# Patient Record
Sex: Female | Born: 1985 | Race: Black or African American | Hispanic: No | Marital: Married | Smoking: Never smoker
Health system: Southern US, Community
[De-identification: ages and names within clinical notes are randomized; demographics above are authoritative.]

## PROBLEM LIST (undated history)

## (undated) HISTORY — PX: ASD REPAIR: SHX258

---

## 2004-05-09 ENCOUNTER — Emergency Department (HOSPITAL_COMMUNITY): Admission: EM | Admit: 2004-05-09 | Discharge: 2004-05-10 | Payer: Self-pay | Admitting: Emergency Medicine

## 2005-12-07 ENCOUNTER — Emergency Department (HOSPITAL_COMMUNITY): Admission: EM | Admit: 2005-12-07 | Discharge: 2005-12-07 | Payer: Self-pay | Admitting: Emergency Medicine

## 2007-02-19 ENCOUNTER — Emergency Department (HOSPITAL_COMMUNITY): Admission: EM | Admit: 2007-02-19 | Discharge: 2007-02-19 | Payer: Self-pay | Admitting: Emergency Medicine

## 2007-08-31 ENCOUNTER — Emergency Department (HOSPITAL_COMMUNITY): Admission: EM | Admit: 2007-08-31 | Discharge: 2007-08-31 | Payer: Self-pay | Admitting: Family Medicine

## 2007-09-07 ENCOUNTER — Emergency Department (HOSPITAL_COMMUNITY): Admission: EM | Admit: 2007-09-07 | Discharge: 2007-09-07 | Payer: Self-pay | Admitting: Emergency Medicine

## 2011-06-09 LAB — GC/CHLAMYDIA PROBE AMP, GENITAL
Chlamydia, DNA Probe: NEGATIVE
GC Probe Amp, Genital: NEGATIVE

## 2011-06-09 LAB — WET PREP, GENITAL
Trich, Wet Prep: NONE SEEN
Yeast Wet Prep HPF POC: NONE SEEN

## 2011-06-09 LAB — POCT PREGNANCY, URINE
Operator id: 235561
Preg Test, Ur: NEGATIVE

## 2011-06-09 LAB — POCT URINALYSIS DIP (DEVICE)
Glucose, UA: NEGATIVE
Hgb urine dipstick: NEGATIVE
Ketones, ur: NEGATIVE
Nitrite: NEGATIVE
Operator id: 235561
Specific Gravity, Urine: 1.025
Urobilinogen, UA: 1
pH: 6

## 2014-02-14 ENCOUNTER — Emergency Department (HOSPITAL_BASED_OUTPATIENT_CLINIC_OR_DEPARTMENT_OTHER): Payer: Managed Care, Other (non HMO)

## 2014-02-14 ENCOUNTER — Encounter (HOSPITAL_BASED_OUTPATIENT_CLINIC_OR_DEPARTMENT_OTHER): Payer: Self-pay | Admitting: Emergency Medicine

## 2014-02-14 ENCOUNTER — Emergency Department (HOSPITAL_BASED_OUTPATIENT_CLINIC_OR_DEPARTMENT_OTHER)
Admission: EM | Admit: 2014-02-14 | Discharge: 2014-02-15 | Disposition: A | Discharge status: Home / Self Care | Payer: Managed Care, Other (non HMO) | Attending: Emergency Medicine | Admitting: Emergency Medicine

## 2014-02-14 DIAGNOSIS — M79604 Pain in right leg: Secondary | ICD-10-CM

## 2014-02-14 DIAGNOSIS — Z880 Allergy status to penicillin: Secondary | ICD-10-CM | POA: Insufficient documentation

## 2014-02-14 DIAGNOSIS — M79605 Pain in left leg: Secondary | ICD-10-CM

## 2014-02-14 DIAGNOSIS — M79609 Pain in unspecified limb: Secondary | ICD-10-CM | POA: Insufficient documentation

## 2014-02-14 DIAGNOSIS — O9989 Other specified diseases and conditions complicating pregnancy, childbirth and the puerperium: Secondary | ICD-10-CM | POA: Insufficient documentation

## 2014-02-14 DIAGNOSIS — Z3490 Encounter for supervision of normal pregnancy, unspecified, unspecified trimester: Secondary | ICD-10-CM

## 2014-02-14 LAB — BASIC METABOLIC PANEL
BUN: 6 mg/dL (ref 6–23)
CO2: 21 mEq/L (ref 19–32)
Calcium: 9.4 mg/dL (ref 8.4–10.5)
Chloride: 102 mEq/L (ref 96–112)
Creatinine, Ser: 0.6 mg/dL (ref 0.50–1.10)
GFR calc Af Amer: >90 mL/min (ref >90)
GFR calc non Af Amer: >90 mL/min (ref >90)
Glucose, Bld: 112 mg/dL — ABNORMAL HIGH (ref 70–99)
Potassium: 3.8 mEq/L (ref 3.7–5.3)
Sodium: 136 mEq/L — ABNORMAL LOW (ref 137–147)

## 2014-02-14 LAB — CBC WITH DIFFERENTIAL/PLATELET
Basophils Absolute: 0 10*3/uL (ref 0.0–0.1)
Basophils Relative: 0 % (ref 0–1)
Eosinophils Absolute: 0.1 10*3/uL (ref 0.0–0.7)
Eosinophils Relative: 1 % (ref 0–5)
HCT: 34.7 % — ABNORMAL LOW (ref 36.0–46.0)
Hemoglobin: 11.8 g/dL — ABNORMAL LOW (ref 12.0–15.0)
Lymphocytes Relative: 35 % (ref 12–46)
Lymphs Abs: 2.8 10*3/uL (ref 0.7–4.0)
MCH: 30.5 pg (ref 26.0–34.0)
MCHC: 34 g/dL (ref 30.0–36.0)
MCV: 89.7 fL (ref 78.0–100.0)
Monocytes Absolute: 1 10*3/uL (ref 0.1–1.0)
Monocytes Relative: 12 % (ref 3–12)
Neutro Abs: 4.1 10*3/uL (ref 1.7–7.7)
Neutrophils Relative %: 51 % (ref 43–77)
Platelets: 240 10*3/uL (ref 150–400)
RBC: 3.87 MIL/uL (ref 3.87–5.11)
RDW: 12.5 % (ref 11.5–15.5)
WBC: 8 10*3/uL (ref 4.0–10.5)

## 2014-02-14 NOTE — ED Notes (Addendum)
Woke with bilat calf pain this am-left pain worse than right-denies any recent travel-states she is [redacted] weeks pregnant and advised via phone by OB to come to ED if pain worse

## 2014-02-14 NOTE — ED Provider Notes (Signed)
CSN: [id number]     Arrival date & time 02/14/14  2049 History  This chart was scribed for Geoffery Lyonsouglas Rubin Dais, MD by Ardelia Memsylan Malpass, ED Scribe. This patient was seen in room MH04/MH04 and the patient's care was started at 11:10 PM.   Chief Complaint  Patient presents with  . Leg Pain    The history is provided by the patient. No language interpreter was used.    HPI Comments: Sheri Franklin is a 28 y.o. female who is [redacted] weeks pregnant with no chronic medical conditions who presents to the Emergency Department complaining of intermittent, severe bilateral calf pain onset this morning. She states that the pain is worse in the left calf. She describes her pain as "throbbing". She states that her pain is worsened with walking. She denies any history of similar pain. She states that this is her first pregnancy. She denies any injury or recent activity change to have contributed to her pain. She denies any associated chest pain or SOB.   History reviewed. No pertinent past medical history. History reviewed. No pertinent past surgical history. No family history on file. History  Substance Use Topics  . Smoking status: Never Smoker   . Smokeless tobacco: Not on file  . Alcohol Use: Yes     Comment: social    OB History   Grav Para Term Preterm Abortions TAB SAB Ect Mult Living   1              Review of Systems A complete 10 system review of systems was obtained and all systems are negative except as noted in the HPI and PMH.   Allergies  Penicillins  Home Medications   Prior to Admission medications   Medication Sig Start Date End Date Taking? Authorizing Provider  Prenatal Multivit-Min-Fe-FA (PRENATAL VITAMINS PO) Take by mouth.   Yes Historical Provider, MD   Triage Vitals: BP 149/78  Pulse 72  Temp(Src) 98.5 F (36.9 C) (Oral)  Resp 20  Ht 5\' 7"  (1.702 m)  Wt 240 lb (108.863 kg)  BMI 37.58 kg/m2  SpO2 99%  Physical Exam  Nursing note and vitals reviewed. Constitutional:  She is oriented to person, place, and time. She appears well-developed and well-nourished. No distress.  HENT:  Head: Normocephalic and atraumatic.  Eyes: Conjunctivae and EOM are normal.  Neck: Neck supple. No tracheal deviation present.  Cardiovascular: Normal rate.   Pulmonary/Chest: Effort normal. No respiratory distress.  Musculoskeletal: Normal range of motion.  There is mild swelling of the bilateral calves. There is no palpable cord. Homan's sign absent bilaterally. Dorsalis pedis pulses are easily palpable bilaterally.  Neurological: She is alert and oriented to person, place, and time.  Skin: Skin is warm and dry.  Psychiatric: She has a normal mood and affect. Her behavior is normal.    ED Course  Procedures (including critical care time)  DIAGNOSTIC STUDIES: Oxygen Saturation is 99% on RA, normal by my interpretation.    COORDINATION OF CARE: 11:14 PM- Discussed plan to obtain diagnostic lab work and radiology. Pt advised of plan for treatment and pt agrees.  Labs Review Labs Reviewed  CBC WITH DIFFERENTIAL - Abnormal; Notable for the following:    Hemoglobin 11.8 (*)    HCT 34.7 (*)    All other components within normal limits  BASIC METABOLIC PANEL    Imaging Review No results found.   EKG Interpretation None      MDM   Final diagnoses:  None  Ultrasound reveals no evidence for DVT and laboratory studies are unremarkable. I suspect a musculoskeletal etiology. There is no edema or hypertension which would be suggestive of preeclampsia. She is to followup with her OB in the upcoming week and understands to return if her symptoms worsen or change.   I personally performed the services described in this documentation, which was scribed in my presence. The recorded information has been reviewed and is accurate.      Geoffery Lyonsouglas Lexie Koehl, MD 02/15/14 [phone]

## 2014-02-15 NOTE — Discharge Instructions (Signed)
Take Tylenol 1000 mg every 6 hours as needed for pain.  Return to the emergency department if your symptoms worsen or change in nature.

## 2014-02-15 NOTE — ED Notes (Signed)
MD at bedside discussing test results and dispo plan of care. 

## 2014-05-23 ENCOUNTER — Inpatient Hospital Stay (HOSPITAL_COMMUNITY)
Admission: AD | Admit: 2014-05-23 | Discharge: 2014-05-26 | DRG: 765 | Disposition: A | Discharge status: Home / Self Care | Payer: Managed Care, Other (non HMO) | Source: Ambulatory Visit | Attending: Obstetrics & Gynecology | Admitting: Obstetrics & Gynecology

## 2014-05-23 ENCOUNTER — Encounter (HOSPITAL_COMMUNITY)
Admission: AD | Disposition: A | Discharge status: Home / Self Care | Payer: Self-pay | Source: Ambulatory Visit | Attending: Obstetrics & Gynecology

## 2014-05-23 ENCOUNTER — Encounter (HOSPITAL_COMMUNITY): Payer: Managed Care, Other (non HMO) | Admitting: Registered Nurse

## 2014-05-23 ENCOUNTER — Encounter (HOSPITAL_COMMUNITY): Payer: Self-pay | Admitting: *Deleted

## 2014-05-23 ENCOUNTER — Inpatient Hospital Stay (HOSPITAL_COMMUNITY): Payer: Managed Care, Other (non HMO)

## 2014-05-23 ENCOUNTER — Inpatient Hospital Stay (HOSPITAL_COMMUNITY): Payer: Managed Care, Other (non HMO) | Admitting: Registered Nurse

## 2014-05-23 DIAGNOSIS — Z3A40 40 weeks gestation of pregnancy: Secondary | ICD-10-CM | POA: Diagnosis present

## 2014-05-23 DIAGNOSIS — O2603 Excessive weight gain in pregnancy, third trimester: Secondary | ICD-10-CM | POA: Diagnosis present

## 2014-05-23 DIAGNOSIS — O9902 Anemia complicating childbirth: Secondary | ICD-10-CM | POA: Diagnosis present

## 2014-05-23 DIAGNOSIS — D62 Acute posthemorrhagic anemia: Secondary | ICD-10-CM | POA: Diagnosis present

## 2014-05-23 DIAGNOSIS — Z9889 Other specified postprocedural states: Secondary | ICD-10-CM

## 2014-05-23 DIAGNOSIS — Z3490 Encounter for supervision of normal pregnancy, unspecified, unspecified trimester: Secondary | ICD-10-CM

## 2014-05-23 LAB — OB RESULTS CONSOLE ABO/RH: RH Type: POSITIVE

## 2014-05-23 LAB — OB RESULTS CONSOLE ANTIBODY SCREEN: Antibody Screen: NEGATIVE

## 2014-05-23 LAB — CBC
HCT: 37.8 % (ref 36.0–46.0)
Hemoglobin: 13 g/dL (ref 12.0–15.0)
MCH: 31.2 pg (ref 26.0–34.0)
MCHC: 34.4 g/dL (ref 30.0–36.0)
MCV: 90.6 fL (ref 78.0–100.0)
Platelets: 217 10*3/uL (ref 150–400)
RBC: 4.17 MIL/uL (ref 3.87–5.11)
RDW: 13.7 % (ref 11.5–15.5)
WBC: 6.8 10*3/uL (ref 4.0–10.5)

## 2014-05-23 LAB — OB RESULTS CONSOLE HIV ANTIBODY (ROUTINE TESTING): HIV: NONREACTIVE

## 2014-05-23 LAB — OB RESULTS CONSOLE HEPATITIS B SURFACE ANTIGEN: Hepatitis B Surface Ag: NEGATIVE

## 2014-05-23 LAB — OB RESULTS CONSOLE RUBELLA ANTIBODY, IGM: Rubella: IMMUNE

## 2014-05-23 LAB — TYPE AND SCREEN
ABO/RH(D): O POS
Antibody Screen: NEGATIVE

## 2014-05-23 LAB — OB RESULTS CONSOLE RPR: RPR: NONREACTIVE

## 2014-05-23 LAB — ABO/RH: ABO/RH(D): O POS

## 2014-05-23 LAB — OB RESULTS CONSOLE GC/CHLAMYDIA
Chlamydia: NEGATIVE
Gonorrhea: NEGATIVE

## 2014-05-23 LAB — OB RESULTS CONSOLE GBS: GBS: NEGATIVE

## 2014-05-23 SURGERY — Surgical Case
Anesthesia: Spinal | Site: Abdomen

## 2014-05-23 MED ORDER — OXYCODONE-ACETAMINOPHEN 5-325 MG PO TABS: 1.0000 | ORAL_TABLET | ORAL | Status: DC | PRN

## 2014-05-23 MED ORDER — SODIUM CHLORIDE 0.9 % IJ SOLN: 3.0000 mL | INTRAMUSCULAR | Status: DC | PRN

## 2014-05-23 MED ORDER — LIDOCAINE HCL (PF) 1 % IJ SOLN: 30.0000 mL | INTRAMUSCULAR | Status: DC | PRN

## 2014-05-23 MED ORDER — FERROUS SULFATE 325 (65 FE) MG PO TABS
325.0000 mg | ORAL_TABLET | Freq: Two times a day (BID) | ORAL | Status: DC
  Administered 2014-05-24 – 2014-05-26 (×5): 325 mg via ORAL
  Filled 2014-05-23 (×5): qty 1

## 2014-05-23 MED ORDER — OXYTOCIN 10 UNIT/ML IJ SOLN
INTRAMUSCULAR | Status: AC
  Filled 2014-05-23: qty 4

## 2014-05-23 MED ORDER — FENTANYL CITRATE 0.05 MG/ML IJ SOLN
INTRAMUSCULAR | Status: DC | PRN
  Administered 2014-05-23: 25 ug via INTRATHECAL

## 2014-05-23 MED ORDER — PHENYLEPHRINE HCL 10 MG/ML IJ SOLN
INTRAMUSCULAR | Status: DC | PRN
  Administered 2014-05-23: 40 ug via INTRAVENOUS
  Administered 2014-05-23 (×2): 80 ug via INTRAVENOUS

## 2014-05-23 MED ORDER — PHENYLEPHRINE 40 MCG/ML (10ML) SYRINGE FOR IV PUSH (FOR BLOOD PRESSURE SUPPORT)
PREFILLED_SYRINGE | INTRAVENOUS | Status: AC
  Filled 2014-05-23: qty 5

## 2014-05-23 MED ORDER — SCOPOLAMINE 1 MG/3DAYS TD PT72: 1.0000 | MEDICATED_PATCH | Freq: Once | TRANSDERMAL | Status: DC

## 2014-05-23 MED ORDER — KETOROLAC TROMETHAMINE 30 MG/ML IJ SOLN
30.0000 mg | Freq: Once | INTRAMUSCULAR | Status: AC
  Administered 2014-05-23: 30 mg via INTRAVENOUS

## 2014-05-23 MED ORDER — SCOPOLAMINE 1 MG/3DAYS TD PT72
MEDICATED_PATCH | TRANSDERMAL | Status: DC | PRN
  Administered 2014-05-23: 1 via TRANSDERMAL

## 2014-05-23 MED ORDER — DIPHENHYDRAMINE HCL 25 MG PO CAPS: 25.0000 mg | ORAL_CAPSULE | Freq: Four times a day (QID) | ORAL | Status: DC | PRN

## 2014-05-23 MED ORDER — ONDANSETRON HCL 4 MG/2ML IJ SOLN
4.0000 mg | Freq: Once | INTRAMUSCULAR | Status: AC
  Administered 2014-05-23: 4 mg via INTRAVENOUS

## 2014-05-23 MED ORDER — HYDROMORPHONE HCL 1 MG/ML IJ SOLN: 0.2500 mg | INTRAMUSCULAR | Status: DC | PRN

## 2014-05-23 MED ORDER — MORPHINE SULFATE 0.5 MG/ML IJ SOLN
INTRAMUSCULAR | Status: AC
  Filled 2014-05-23: qty 10

## 2014-05-23 MED ORDER — LANOLIN HYDROUS EX OINT: 1.0000 "application " | TOPICAL_OINTMENT | CUTANEOUS | Status: DC | PRN

## 2014-05-23 MED ORDER — NALOXONE HCL 1 MG/ML IJ SOLN: 1.0000 ug/kg/h | INTRAVENOUS | Status: DC | PRN

## 2014-05-23 MED ORDER — ONDANSETRON HCL 4 MG/2ML IJ SOLN: 4.0000 mg | Freq: Three times a day (TID) | INTRAMUSCULAR | Status: DC | PRN

## 2014-05-23 MED ORDER — MENTHOL 3 MG MT LOZG: 1.0000 | LOZENGE | OROMUCOSAL | Status: DC | PRN

## 2014-05-23 MED ORDER — INFLUENZA VAC SPLIT QUAD 0.5 ML IM SUSY
0.5000 mL | PREFILLED_SYRINGE | INTRAMUSCULAR | Status: AC
  Administered 2014-05-25: 0.5 mL via INTRAMUSCULAR
  Filled 2014-05-23: qty 0.5

## 2014-05-23 MED ORDER — PHENYLEPHRINE 40 MCG/ML (10ML) SYRINGE FOR IV PUSH (FOR BLOOD PRESSURE SUPPORT): 80.0000 ug | PREFILLED_SYRINGE | Freq: Once | INTRAVENOUS | Status: DC

## 2014-05-23 MED ORDER — OXYTOCIN 40 UNITS IN LACTATED RINGERS INFUSION - SIMPLE MED: 62.5000 mL/h | INTRAVENOUS | Status: DC

## 2014-05-23 MED ORDER — PHENYLEPHRINE 8 MG IN D5W 100 ML (0.08MG/ML) PREMIX OPTIME
INJECTION | INTRAVENOUS | Status: DC | PRN
  Administered 2014-05-23: 60 ug/min via INTRAVENOUS

## 2014-05-23 MED ORDER — DIPHENHYDRAMINE HCL 50 MG/ML IJ SOLN: 12.5000 mg | INTRAMUSCULAR | Status: DC | PRN

## 2014-05-23 MED ORDER — CLINDAMYCIN PHOSPHATE 900 MG/50ML IV SOLN: 900.0000 mg | Freq: Once | INTRAVENOUS | Status: DC

## 2014-05-23 MED ORDER — LACTATED RINGERS IV SOLN
INTRAVENOUS | Status: DC
Start: 2014-05-23 — End: 2014-05-23
  Administered 2014-05-23 (×3): via INTRAVENOUS

## 2014-05-23 MED ORDER — SIMETHICONE 80 MG PO CHEW
80.0000 mg | CHEWABLE_TABLET | ORAL | Status: DC
  Administered 2014-05-24 – 2014-05-25 (×3): 80 mg via ORAL
  Filled 2014-05-23 (×3): qty 1

## 2014-05-23 MED ORDER — DIPHENHYDRAMINE HCL 25 MG PO CAPS: 25.0000 mg | ORAL_CAPSULE | ORAL | Status: DC | PRN

## 2014-05-23 MED ORDER — KETOROLAC TROMETHAMINE 30 MG/ML IJ SOLN
INTRAMUSCULAR | Status: AC
  Administered 2014-05-23: 30 mg via INTRAVENOUS
  Filled 2014-05-23: qty 1

## 2014-05-23 MED ORDER — OXYTOCIN BOLUS FROM INFUSION: 500.0000 mL | INTRAVENOUS | Status: DC

## 2014-05-23 MED ORDER — ONDANSETRON HCL 4 MG/2ML IJ SOLN: 4.0000 mg | INTRAMUSCULAR | Status: DC | PRN

## 2014-05-23 MED ORDER — NALOXONE HCL 0.4 MG/ML IJ SOLN: 0.4000 mg | INTRAMUSCULAR | Status: DC | PRN

## 2014-05-23 MED ORDER — OXYCODONE-ACETAMINOPHEN 5-325 MG PO TABS: 2.0000 | ORAL_TABLET | ORAL | Status: DC | PRN

## 2014-05-23 MED ORDER — FLEET ENEMA 7-19 GM/118ML RE ENEM: 1.0000 | ENEMA | RECTAL | Status: DC | PRN

## 2014-05-23 MED ORDER — LACTATED RINGERS IV SOLN
40.0000 [IU] | INTRAVENOUS | Status: DC | PRN
  Administered 2014-05-23: 40 [IU] via INTRAVENOUS

## 2014-05-23 MED ORDER — ONDANSETRON HCL 4 MG/2ML IJ SOLN
INTRAMUSCULAR | Status: AC
  Administered 2014-05-23: 4 mg via INTRAVENOUS
  Filled 2014-05-23: qty 2

## 2014-05-23 MED ORDER — NALBUPHINE HCL 10 MG/ML IJ SOLN: 5.0000 mg | INTRAMUSCULAR | Status: DC | PRN

## 2014-05-23 MED ORDER — WITCH HAZEL-GLYCERIN EX PADS: 1.0000 "application " | MEDICATED_PAD | CUTANEOUS | Status: DC | PRN

## 2014-05-23 MED ORDER — MORPHINE SULFATE (PF) 0.5 MG/ML IJ SOLN
INTRAMUSCULAR | Status: DC | PRN
  Administered 2014-05-23: .1 mg via INTRATHECAL

## 2014-05-23 MED ORDER — FENTANYL CITRATE 0.05 MG/ML IJ SOLN
INTRAMUSCULAR | Status: AC
  Filled 2014-05-23: qty 2

## 2014-05-23 MED ORDER — LACTATED RINGERS IV SOLN: 500.0000 mL | INTRAVENOUS | Status: DC | PRN

## 2014-05-23 MED ORDER — GENTAMICIN SULFATE 40 MG/ML IJ SOLN
Freq: Once | INTRAVENOUS | Status: AC
  Administered 2014-05-23: 116.5 mL via INTRAVENOUS
  Filled 2014-05-23: qty 10.5

## 2014-05-23 MED ORDER — NALBUPHINE HCL 10 MG/ML IJ SOLN: 5.0000 mg | Freq: Once | INTRAMUSCULAR | Status: AC | PRN

## 2014-05-23 MED ORDER — TETANUS-DIPHTH-ACELL PERTUSSIS 5-2.5-18.5 LF-MCG/0.5 IM SUSP: 0.5000 mL | Freq: Once | INTRAMUSCULAR | Status: DC

## 2014-05-23 MED ORDER — ACETAMINOPHEN 325 MG PO TABS: 650.0000 mg | ORAL_TABLET | ORAL | Status: DC | PRN

## 2014-05-23 MED ORDER — CITRIC ACID-SODIUM CITRATE 334-500 MG/5ML PO SOLN
30.0000 mL | ORAL | Status: DC | PRN
  Administered 2014-05-23: 30 mL via ORAL

## 2014-05-23 MED ORDER — DIBUCAINE 1 % RE OINT: 1.0000 "application " | TOPICAL_OINTMENT | RECTAL | Status: DC | PRN

## 2014-05-23 MED ORDER — SENNOSIDES-DOCUSATE SODIUM 8.6-50 MG PO TABS
2.0000 | ORAL_TABLET | ORAL | Status: DC
  Administered 2014-05-24 – 2014-05-25 (×3): 2 via ORAL
  Filled 2014-05-23 (×3): qty 2

## 2014-05-23 MED ORDER — ONDANSETRON HCL 4 MG/2ML IJ SOLN: 4.0000 mg | Freq: Four times a day (QID) | INTRAMUSCULAR | Status: DC | PRN

## 2014-05-23 MED ORDER — KETOROLAC TROMETHAMINE 30 MG/ML IJ SOLN: 30.0000 mg | Freq: Four times a day (QID) | INTRAMUSCULAR | Status: DC | PRN

## 2014-05-23 MED ORDER — MEPERIDINE HCL 25 MG/ML IJ SOLN: 6.2500 mg | INTRAMUSCULAR | Status: DC | PRN

## 2014-05-23 MED ORDER — ZOLPIDEM TARTRATE 5 MG PO TABS: 5.0000 mg | ORAL_TABLET | Freq: Every evening | ORAL | Status: DC | PRN

## 2014-05-23 MED ORDER — SCOPOLAMINE 1 MG/3DAYS TD PT72
MEDICATED_PATCH | TRANSDERMAL | Status: AC
  Filled 2014-05-23: qty 1

## 2014-05-23 MED ORDER — ONDANSETRON HCL 4 MG/2ML IJ SOLN
INTRAMUSCULAR | Status: DC | PRN
  Administered 2014-05-23: 4 mg via INTRAVENOUS

## 2014-05-23 MED ORDER — BUPIVACAINE HCL (PF) 0.25 % IJ SOLN
INTRAMUSCULAR | Status: AC
  Filled 2014-05-23: qty 10

## 2014-05-23 MED ORDER — PHENYLEPHRINE HCL 10 MG/ML IJ SOLN: 0.0800 mg | Freq: Once | INTRAMUSCULAR | Status: DC

## 2014-05-23 MED ORDER — PRENATAL MULTIVITAMIN CH
1.0000 | ORAL_TABLET | Freq: Every day | ORAL | Status: DC
  Administered 2014-05-24 – 2014-05-26 (×3): 1 via ORAL
  Filled 2014-05-23 (×3): qty 1

## 2014-05-23 MED ORDER — MAGNESIUM HYDROXIDE 400 MG/5ML PO SUSP: 30.0000 mL | ORAL | Status: DC | PRN

## 2014-05-23 MED ORDER — ONDANSETRON HCL 4 MG PO TABS
4.0000 mg | ORAL_TABLET | ORAL | Status: DC | PRN
  Filled 2014-05-23: qty 1

## 2014-05-23 MED ORDER — CITRIC ACID-SODIUM CITRATE 334-500 MG/5ML PO SOLN
ORAL | Status: AC
  Filled 2014-05-23: qty 15

## 2014-05-23 MED ORDER — BUPIVACAINE HCL (PF) 0.25 % IJ SOLN
INTRAMUSCULAR | Status: DC | PRN
  Administered 2014-05-23: 10 mL

## 2014-05-23 MED ORDER — ONDANSETRON HCL 4 MG/2ML IJ SOLN
INTRAMUSCULAR | Status: AC
  Filled 2014-05-23: qty 2

## 2014-05-23 MED ORDER — OXYTOCIN 40 UNITS IN LACTATED RINGERS INFUSION - SIMPLE MED: 62.5000 mL/h | INTRAVENOUS | Status: AC

## 2014-05-23 MED ORDER — SIMETHICONE 80 MG PO CHEW: 80.0000 mg | CHEWABLE_TABLET | ORAL | Status: DC | PRN

## 2014-05-23 MED ORDER — LACTATED RINGERS IV SOLN
INTRAVENOUS | Status: DC | PRN
  Administered 2014-05-23: 14:00:00 via INTRAVENOUS

## 2014-05-23 MED ORDER — DEXTROSE 5 % IV SOLN: 2.5000 mg/kg | Freq: Once | INTRAVENOUS | Status: DC

## 2014-05-23 MED ORDER — OXYCODONE-ACETAMINOPHEN 5-325 MG PO TABS
1.0000 | ORAL_TABLET | ORAL | Status: DC | PRN
  Administered 2014-05-24 – 2014-05-26 (×5): 1 via ORAL
  Filled 2014-05-23 (×6): qty 1

## 2014-05-23 MED ORDER — IBUPROFEN 600 MG PO TABS
600.0000 mg | ORAL_TABLET | Freq: Four times a day (QID) | ORAL | Status: DC
  Administered 2014-05-24 – 2014-05-26 (×11): 600 mg via ORAL
  Filled 2014-05-23 (×11): qty 1

## 2014-05-23 MED ORDER — LACTATED RINGERS IV SOLN
INTRAVENOUS | Status: DC
  Administered 2014-05-23: 22:00:00 via INTRAVENOUS

## 2014-05-23 MED ORDER — SIMETHICONE 80 MG PO CHEW
80.0000 mg | CHEWABLE_TABLET | Freq: Three times a day (TID) | ORAL | Status: DC
  Administered 2014-05-24 – 2014-05-26 (×8): 80 mg via ORAL
  Filled 2014-05-23 (×8): qty 1

## 2014-05-23 SURGICAL SUPPLY — 46 items
ADH SKN CLS APL DERMABOND .7 (GAUZE/BANDAGES/DRESSINGS)
ADH SKN CLS LQ APL DERMABOND (GAUZE/BANDAGES/DRESSINGS) ×1
CLAMP CORD UMBIL (MISCELLANEOUS) IMPLANT
CLOTH BEACON ORANGE TIMEOUT ST (SAFETY) ×2 IMPLANT
CONTAINER PREFILL 10% NBF 15ML (MISCELLANEOUS) IMPLANT
COVER LIGHT HANDLE  1/PK (MISCELLANEOUS) ×2
COVER LIGHT HANDLE 1/PK (MISCELLANEOUS) ×2 IMPLANT
DERMABOND ADHESIVE PROPEN (GAUZE/BANDAGES/DRESSINGS) ×1
DERMABOND ADVANCED (GAUZE/BANDAGES/DRESSINGS)
DERMABOND ADVANCED .7 DNX12 (GAUZE/BANDAGES/DRESSINGS) IMPLANT
DERMABOND ADVANCED .7 DNX6 (GAUZE/BANDAGES/DRESSINGS) IMPLANT
DRAPE SHEET LG 3/4 BI-LAMINATE (DRAPES) IMPLANT
DRSG OPSITE POSTOP 4X10 (GAUZE/BANDAGES/DRESSINGS) ×2 IMPLANT
DURAPREP 26ML APPLICATOR (WOUND CARE) ×2 IMPLANT
ELECT REM PT RETURN 9FT ADLT (ELECTROSURGICAL) ×2
ELECTRODE REM PT RTRN 9FT ADLT (ELECTROSURGICAL) ×1 IMPLANT
EXTRACTOR VACUUM M CUP 4 TUBE (SUCTIONS) IMPLANT
GLOVE BIO SURGEON STRL SZ 6.5 (GLOVE) ×2 IMPLANT
GLOVE BIOGEL PI IND STRL 7.0 (GLOVE) ×1 IMPLANT
GLOVE BIOGEL PI INDICATOR 7.0 (GLOVE) ×1
GOWN STRL REUS W/TWL LRG LVL3 (GOWN DISPOSABLE) ×4 IMPLANT
KIT ABG SYR 3ML LUER SLIP (SYRINGE) IMPLANT
NDL HYPO 25X5/8 SAFETYGLIDE (NEEDLE) IMPLANT
NEEDLE HYPO 22GX1.5 SAFETY (NEEDLE) ×2 IMPLANT
NEEDLE HYPO 25X5/8 SAFETYGLIDE (NEEDLE) IMPLANT
PACK C SECTION WH (CUSTOM PROCEDURE TRAY) ×2 IMPLANT
PAD OB MATERNITY 4.3X12.25 (PERSONAL CARE ITEMS) ×2 IMPLANT
RTRCTR C-SECT PINK 25CM LRG (MISCELLANEOUS) ×2 IMPLANT
STAPLER VISISTAT 35W (STAPLE) IMPLANT
SUT PLAIN 0 NONE (SUTURE) IMPLANT
SUT PLAIN 2 0 (SUTURE) ×2
SUT PLAIN 2 0 XLH (SUTURE) ×1 IMPLANT
SUT PLAIN ABS 2-0 CT1 27XMFL (SUTURE) ×1 IMPLANT
SUT VIC AB 0 CT1 27 (SUTURE) ×4
SUT VIC AB 0 CT1 27XBRD ANBCTR (SUTURE) ×2 IMPLANT
SUT VIC AB 0 CTX 36 (SUTURE) ×6
SUT VIC AB 0 CTX36XBRD ANBCTRL (SUTURE) ×2 IMPLANT
SUT VIC AB 2-0 CT1 27 (SUTURE) ×2
SUT VIC AB 2-0 CT1 TAPERPNT 27 (SUTURE) ×1 IMPLANT
SUT VIC AB 3-0 SH 27 (SUTURE)
SUT VIC AB 3-0 SH 27X BRD (SUTURE) IMPLANT
SUT VIC AB 4-0 PS2 27 (SUTURE) ×2 IMPLANT
SYR CONTROL 10ML LL (SYRINGE) ×2 IMPLANT
TOWEL OR 17X24 6PK STRL BLUE (TOWEL DISPOSABLE) ×2 IMPLANT
TRAY FOLEY CATH 14FR (SET/KITS/TRAYS/PACK) ×2 IMPLANT
WATER STERILE IRR 1000ML POUR (IV SOLUTION) IMPLANT

## 2014-05-23 NOTE — Anesthesia Preprocedure Evaluation (Addendum)
Anesthesia Evaluation  Patient identified by MRN, date of birth, ID band Patient awake    Reviewed: Allergy & Precautions, H&P , Patient's Chart, lab work & pertinent test results  History of Anesthesia Complications (+) PONV  Airway Mallampati: I TM Distance: >3 FB Neck ROM: full    Dental no notable dental hx.    Pulmonary  breath sounds clear to auscultation  Pulmonary exam normal       Cardiovascular Exercise Tolerance: Good + Valvular Problems/Murmurs (s/p ASD repair) Rhythm:regular Rate:Normal     Neuro/Psych    GI/Hepatic   Endo/Other    Renal/GU      Musculoskeletal   Abdominal   Peds  Hematology   Anesthesia Other Findings   Reproductive/Obstetrics (+) Pregnancy (urgent C/S for BPP 2/10)                          Anesthesia Physical Anesthesia Plan  ASA: III and emergent  Anesthesia Plan: Spinal   Post-op Pain Management:    Induction:   Airway Management Planned:   Additional Equipment:   Intra-op Plan:   Post-operative Plan:   Informed Consent: I have reviewed the patients History and Physical, chart, labs and discussed the procedure including the risks, benefits and alternatives for the proposed anesthesia with the patient or authorized representative who has indicated his/her understanding and acceptance.   Dental Advisory Given  Plan Discussed with: CRNA  Anesthesia Plan Comments: (Lab work confirmed with CRNA in room. Platelets okay. Discussed spinal anesthetic, and patient consents to the procedure:  included risk of possible headache,backache, failed block, allergic reaction, and nerve injury. This patient was asked if she had any questions or concerns before the procedure started. )        Anesthesia Quick Evaluation

## 2014-05-23 NOTE — H&P (Addendum)
Sheri Franklin is a 28 y.o. female G1P0 2[name] presenting for BPP 2/8 in office.  HPP/HPI:  Normal pregnancy.  No AF leak, no vaginal bleeding.  FM pos per patient prior to BPP.  No PEC Sx.  Presenting for ROB.  FHT at 107/min.  BPP done, 2/8 (2 for AF).  [facility]repeat [facility] and prepare for probable urgent C/S.    OB History   Grav Para Term Preterm Abortions TAB SAB Ect Mult Living   1              History reviewed. No pertinent past medical history. Past Surgical History  Procedure Laterality Date  . Asd repair     Family History: family history is not on file. Social History:  reports that she has never smoked. She does not have any smokeless tobacco history on file. She reports that she drinks alcohol. She reports that she does not use illicit drugs.  Allergies  Allergen Reactions  . Penicillins Rash    Dilation: Closed Effacement (%): Thick Blood pressure 148/64, pulse 76, temperature 98.6 F (37 C), temperature source Oral, height 5\' 6"  (1.676 m), weight 121.564 kg (268 lb). Exam Physical Exam  NST non reactive.  Variability present, but decreased.  Possible late deceleration with unique UC on monitoring.    BPP started but no FM, No Br. M after about 5 min.  Interrupted to proceed with urgent C/S.  HPP:  Patient Active Problem List   Diagnosis Date Noted  . Pregnancy 05/23/2014    Prenatal labs: ABO, Rh: O/Positive/-- (10/01 0000) Antibody: Negative (10/01 0000) Rubella:  Immune RPR: Nonreactive (10/01 0000)  HBsAg: Negative (10/01 0000)  HIV: Non-reactive (10/01 0000)  Genetic testing:wnl US anato: wnl  Fetal cardiac Echo wnl 1 hr GTT: wnl GBS: Negative (10/01 0000)   Assessment/Plan: Urgent C/S for non-reassuring Fetal well being with BPP 2/10.  Informed consent sign.   Sheri Franklin,Sheri Franklin 05/23/2014, 1:11 PM

## 2014-05-23 NOTE — Anesthesia Procedure Notes (Signed)

## 2014-05-23 NOTE — Anesthesia Postprocedure Evaluation (Signed)
  Anesthesia Post-op Note  Patient: Sheri Franklin  Procedure(s) Performed: Procedure(s): CESAREAN SECTION (N/A)  Patient is awake, responsive, moving her legs, and has signs of resolution of her numbness. Pain and nausea are reasonably well controlled. Vital signs are stable and clinically acceptable. Oxygen saturation is clinically acceptable. There are no apparent anesthetic complications at this time. Patient is ready for discharge.

## 2014-05-23 NOTE — Addendum Note (Signed)
Addendum created 05/23/14 1726 by Orlie Pollenebra R Mystery Schrupp, CRNA   Modules edited: Notes Section   Notes Section:  File: [id number]

## 2014-05-23 NOTE — Op Note (Signed)
Preoperative diagnosis: Intrauterine pregnancy at 40 weeks and 1 day                                            Fetal well-being non-reassuring with BPP 2/10  Post operative diagnosis: Same  Anesthesia: Spinal  Anesthesiologist: Dr. Cristela BlueKyle Jackson  Procedure: Primary urgent low transverse cesarean section  Surgeon: Dr. Genia DelMarie-Lyne Jhonathan Desroches  Assistant: Donette LarryMelanie Bhambri   Estimated blood loss: 1000 cc  Procedure:  After being informed of the planned procedure and possible complications including bleeding, infection, injury to other organs, informed consent is obtained. The patient is taken to OR #1 and given spinal anesthesia without complication. She is placed in the dorsal decubitus position with the pelvis tilted to the left. She is then prepped and draped in a sterile fashion. A Foley catheter is inserted in her bladder.  After assessing adequate level of anesthesia, we infiltrate the suprapubic area with 20 cc of Marcaine 0.25 and perform a Pfannenstiel incision which is brought down sharply to the fascia. The fascia is entered in a low transverse fashion. Linea alba is dissected. Peritoneum is entered in a midline fashion. An Alexis retractor is easily positioned. Visceral peritoneum is entered in a low transverse fashion allowing us to safely retract bladder by developing a bladder flap.  The myometrium is then entered in a low transverse fashion; first with knife and then extended bluntly. Amniotic fluid is clear. We assist the birth of a female  infant in cephalic presentation. Mouth and nose are suctioned. The baby is delivered. The cord is clamped and sectioned. The baby is given to the neonatologist present in the room.  10 cc of blood is drawn from the umbilical vein.The placenta is allowed to deliver spontaneously. It is complete and the cord has 3 vessels. Uterine revision is negative.  We proceed with closure of the myometrium in 2 layers: First with a running locked suture of 0  Vicryl, then with a Lembert suture of 0 Vicryl imbricating the first one. Hemostasis is completed with a figure of eight with Vicryl 0 in midline and with cauterization on peritoneal edges.  Both paracolic gutters are cleaned. Both tubes and ovaries are assessed and normal.  Small subserosal myomas are present on the anterior wall of the uterus.  We confirm a satisfactory hemostasis.  Retractors and sponges are removed. Under fascia hemostasis is completed with cauterization.  The parietal peritoneum is closed with Vicryl 2-0.  The fascia is then closed with 2 running sutures of 0 Vicryl meeting midline.  Hemostasis is completed with cauterization. The skin is closed with a subcuticular suture of 3-0 Vicryl and Dermabond.  A Honeycomb dressing is added.  Instrument and sponge count is complete x2. Estimated blood loss is 1000 cc.  The procedure is well tolerated by the patient who is taken to recovery room in a well and stable condition.  Female baby was born at 13:28 and received an Apgar of 8  at 1 minute and 9 at 5 minutes. Weight was pending.    Specimen: Placenta sent to Pathology   [place] Hijazi,MARIE-LYNE MD 10/1/20152:20 PM

## 2014-05-23 NOTE — Anesthesia Postprocedure Evaluation (Signed)
  Anesthesia Post-op Note  Patient: Sheri Franklin  Procedure(s) Performed: Procedure(s): CESAREAN SECTION (N/A)  Patient Location: PACU and Mother/Baby  Anesthesia Type:Spinal  Level of Consciousness: awake, alert , oriented and patient cooperative  Airway and Oxygen Therapy: Patient Spontanous Breathing  Post-op Pain: none  Post-op Assessment: Post-op Vital signs reviewed, Patient's Cardiovascular Status Stable, Respiratory Function Stable, Patent Airway, No signs of Nausea or vomiting, Adequate PO intake, Pain level controlled, No headache, No backache, No residual numbness and No residual motor weakness  Post-op Vital Signs: Reviewed and stable  Last Vitals:  Filed Vitals:   05/23/14 1700  BP: 111/61  Pulse: 62  Temp: 36.3 C  Resp: 20    Complications: No apparent anesthesia complications

## 2014-05-23 NOTE — Transfer of Care (Signed)
Immediate Anesthesia Transfer of Care Note  Patient: Sheri Franklin  Procedure(s) Performed: Procedure(s): CESAREAN SECTION (N/A)  Patient Location: PACU  Anesthesia Type:Spinal  Level of Consciousness: awake, alert  and oriented  Airway & Oxygen Therapy: Patient Spontanous Breathing  Post-op Assessment: Report given to PACU RN  Post vital signs: Reviewed  Complications: No apparent anesthesia complications

## 2014-05-24 ENCOUNTER — Encounter (HOSPITAL_COMMUNITY): Payer: Self-pay | Admitting: Obstetrics & Gynecology

## 2014-05-24 LAB — CBC
HCT: 29.2 % — ABNORMAL LOW (ref 36.0–46.0)
Hemoglobin: 10 g/dL — ABNORMAL LOW (ref 12.0–15.0)
MCH: 30.9 pg (ref 26.0–34.0)
MCHC: 34.2 g/dL (ref 30.0–36.0)
MCV: 90.1 fL (ref 78.0–100.0)
Platelets: 184 10*3/uL (ref 150–400)
RBC: 3.24 MIL/uL — ABNORMAL LOW (ref 3.87–5.11)
RDW: 13.8 % (ref 11.5–15.5)
WBC: 10.1 10*3/uL (ref 4.0–10.5)

## 2014-05-24 LAB — RPR

## 2014-05-24 NOTE — Progress Notes (Signed)
Subjective: POD# 1 Information for the patient's newborn:  Sheri Franklin, Sheri Franklin [[id number]][[id number]]  female  / circ planning  Reports feeling sore, but well Feeding: breast - baby on triple bili lights Patient reports tolerating PO.  Breast symptoms: none Pain controlled with ibuprofen (OTC) and narcotic analgesics including Percocet Denies HA/SOB/C/P/N/V/dizziness. Flatus absent. She reports vaginal bleeding as normal, without clots.  She is ambulating, urinating without difficult.     Objective:   VS:  Filed Vitals:   05/24/14 0030 05/24/14 0230 05/24/14 0645 05/24/14 0915  BP: 115/61 124/63 117/68 125/76  Pulse: 64 62 59 68  Temp: 98 F (36.7 C) 98.1 F (36.7 C) 99 F (37.2 C) 98.3 F (36.8 C)  TempSrc: Oral Oral Oral   Resp: 20 18 18 20   Height:      Weight:      SpO2: 98% 99% 96% 98%     Intake/Output Summary (Last 24 hours) at 05/24/14 1344 Last data filed at 05/24/14 1100  Gross per 24 hour  Intake 3443.75 ml  Output   3050 ml  Net 393.75 ml        Recent Labs  05/23/14 1245 05/24/14 0630  WBC 6.8 10.1  HGB 13.0 10.0*  HCT 37.8 29.2*  PLT 217 184     Blood type: O POS (10/01 1245)  Rubella: Immune (10/01 0000)     Physical Exam:  General: alert, cooperative and no distress CV: Regular rate and rhythm, S1S2 present or without murmur or extra heart sounds Resp: clear Abdomen: soft, nontender, hypoactive bowel sounds Incision: Tegaderm and Honeycomb dressing C/D/I - skin well- approximated with sutures Uterine Fundus: firm, below umbilicus, nontender Lochia: minimal Ext: edema 1+ and Homans sign is negative, no sign of DVT / SCD hose in place   Assessment/Plan: 28 y.o.   POD# 1.  s/p Cesarean Delivery.  Indications: urgent d/t non-reassuring fetal well-being (BPP 2/8)                Principal Problem:   Postpartum care following cesarean delivery (10/1)  Doing well, stable.               Regular diet as tolerated Ambulate  Drink warm fluids Routine  post-op care  Kenard GowerAWSON, Sheri Franklin, M, MSN, CNM 05/24/2014, 1:44 PM

## 2014-05-25 NOTE — Lactation Note (Signed)
This note was copied from the chart of Sheri Thea GistKaturah Ley. Lactation Consultation Note Follow up visit at 58 hours of age.  Mom reports breast are filling and she was started with a DEBP from RN earlier today.  Mom has mild engorgement with discomfort.  Offered Ice packs for 20 minutes after feedings and encouraged to pump breast soft for latching prior to feedings. Mom reports baby feeding well.  Baby asleep in crib with phototherapy at this time.   Mom to call for assist as needed.    Patient Name: Sheri Franklin ONGEX'BToday's Date: 05/25/2014 Reason for consult: Follow-up assessment   Maternal Data    Feeding Feeding Type: Breast Fed Length of feed: 20 min  LATCH Score/Interventions                      Lactation Tools Discussed/Used     Consult Status Consult Status: Follow-up Date: 05/26/14 Follow-up type: In-patient    Sheri Franklin, Sheri Franklin 05/25/2014, 11:59 PM

## 2014-05-25 NOTE — Progress Notes (Signed)
POSTOPERATIVE DAY # 2 S/P CS   S:         Reports feeling ok - tired and some soreness today more than yesterday             Tolerating po intake / no nausea / no vomiting / + flatus / no BM             Bleeding is light             Pain controlled with motrin and percocet             Up ad lib / ambulatory/ voiding QS  Newborn breast  / Circumcision planned today ( Dr C) / baby under bili-light past 36 hours - no discharge today  O:  VS: BP 121/89  Pulse 78  Temp(Src) 98.4 F (36.9 C) (Oral)  Resp 18  Ht 5\' 6"  (1.676 m)  Wt 121.564 kg (268 lb)  BMI 43.28 kg/m2  SpO2 98%  Breastfeeding? Unknown   LABS:               Recent Labs  05/23/14 1245 05/24/14 0630  WBC 6.8 10.1  HGB 13.0 10.0*  PLT 217 184               Bloodtype: --/--/O POS (10/01 1245)  Rubella: Immune (10/01 0000)                                Physical Exam:             Alert and Oriented X3  Lungs: Clear and unlabored  Heart: regular rate and rhythm / no mumurs  Abdomen: soft, non-tender, slightly distended, active BS             Fundus: firm, non-tender, Ueven             Dressing intact honeycomb              Incision:  approximated with suture / no erythema / no ecchymosis / no drainage  Perineum: intact  Lochia: light  Extremities: trace edema, no calf pain or tenderness, negative Homans  A:        POD # 2 S/P CS             P:        Routine postoperative care              Anticipate DC tomorrow - may room-in with baby if continued inpatient stay necessary with bilirubin levels    Marlinda MikeBAILEY, Earlie Arciga CNM, MSN, FACNM 05/25/2014, 10:26 AM

## 2014-05-26 MED ORDER — IBUPROFEN 600 MG PO TABS: 600.0000 mg | ORAL_TABLET | Freq: Four times a day (QID) | ORAL | Status: DC

## 2014-05-26 MED ORDER — OXYCODONE-ACETAMINOPHEN 5-325 MG PO TABS
1.0000 | ORAL_TABLET | ORAL | Status: DC | PRN
Start: 2014-05-26 — End: 2021-05-30

## 2014-05-26 NOTE — Lactation Note (Signed)
This note was copied from the chart of Sheri Thea GistKaturah Tendler. Lactation Consultation Note     Follow up consult with this mom of a term baby, going home today, now 69 hours post partum. Mom's milk has transitioned in, and her engorgement is less than yesterday, as per mom. Breasat care reviewed, and mom has a DEP at home. Mom knows to call lactation, after discharge, for questions/concerns. Breast feeding going well.Mom will continue to supplement with EBM prn  Patient Name: Sheri Franklin NWGNF'AToday's Date: 05/26/2014 Reason for consult: Follow-up assessment   Maternal Data    Feeding Feeding Type: Breast Fed Length of feed: 20 min  LATCH Score/Interventions Latch: Grasps breast easily, tongue down, lips flanged, rhythmical sucking.  Audible Swallowing: Spontaneous and intermittent  Type of Nipple: Everted at rest and after stimulation  Comfort (Breast/Nipple): Filling, red/small blisters or bruises, mild/mod discomfort  Problem noted: Mild/Moderate discomfort Interventions (Mild/moderate discomfort): Post-pump  Hold (Positioning): No assistance needed to correctly position infant at breast.  LATCH Score: 9  Lactation Tools Discussed/Used     Consult Status Consult Status: Complete Follow-up type: Call as needed    Sheri Franklin, Sheri Franklin 05/26/2014, 11:23 AM

## 2014-05-26 NOTE — Discharge Summary (Signed)
  POSTOPERATIVE DISCHARGE SUMMARY:  Patient ID: Sheri ParmaKaturah L Ortloff MRN: [id number] DOB/AGE: [birth year] 28 y.o.  Admit date: 05/23/2014 Admission Diagnoses: 40.1weeks / Non-reactive NST and abnormal biophysical profile / category 3 tracing - fetal bradycardia with late decelerations  Discharge date:  05/26/2014 Discharge Diagnoses: POD 3 [facility] and [facility]  Prenatal history: G1P1001   EDC : 05/22/2014, by Other Basis  [facility]  Primary provider : Dr Cherly Hensenousins Prenatal course complicated hx ASD repair / palpitations antepartum - seen by cardiology / abnormal fetal echo - tricuspid insufficiency / excessive weight gain in pregnancy (50 pounds)   AT testing at [facility] for postterm-  Fetal bradycardia on sonogram 105-107 ,  BPP 2/8 - [facility] by [facility] section as remote from delivery and abnormal biophysical profile.  Prenatal Labs: ABO, Rh: --/--/O POS (10/01 1245)  Antibody: NEG (10/01 1242) Rubella: Immune (10/01 0000)  RPR: NON REAC (10/01 1245)  HBsAg: Negative (10/01 0000)  HIV: Non-reactive (10/01 0000)  GTT : NL GBS: Negative (10/01 0000)   Medical / Surgical History :  Past medical history: History reviewed. No pertinent past medical history.  Past surgical history:  Past Surgical History  Procedure Laterality Date  . Asd repair    . Cesarean section N/A 05/23/2014    Procedure: CESAREAN SECTION;  Surgeon: Genia DelMarie-Lyne Lavoie, MD;  Location: [facility] ORS;  Service: Obstetrics;  Laterality: N/A;    Family History: History reviewed. No pertinent family history.  Social History:  reports that she has never smoked. She does not have any smokeless tobacco history on file. She reports that she drinks alcohol. She reports that she does not use illicit drugs.  Allergies: Doxycycline; Flagyl; and Penicillins   Current Medications at time of admission:  Prior to Admission medications    Medication Sig Start Date End Date Taking? Authorizing Provider  Prenatal Multivit-Min-Fe-FA (PRENATAL VITAMINS PO) Take 1 tablet by mouth daily.    Yes Historical Provider, MD   Procedures: Cesarean section delivery on 05/23/2014 with delivery of viable female newborn by Dr Seymour BarsLavoie   See operative report for further details APGAR (1 MIN): 8   APGAR (5 MINS): 9    Postoperative / postpartum course:  Uncomplicated with discharge on POD 3 Newborn initiated in bili-lights POD #1  Discharge Instructions:  Discharged Condition: stable  Activity: pelvic rest and postoperative restrictions x 2   Diet: routine  Medications:    Medication List         ibuprofen 600 MG tablet  Commonly known as:  ADVIL,MOTRIN  Take 1 tablet (600 mg total) by mouth every 6 (six) hours.     oxyCODONE-acetaminophen 5-325 MG per tablet  Commonly known as:  PERCOCET/ROXICET  Take 1 tablet by mouth every 4 (four) hours as needed (for pain scale less than 7).     PRENATAL VITAMINS PO  Take 1 tablet by mouth daily.        Wound Care: keep clean and dry / remove honeycomb by POD #7 Postpartum Instructions: [facility] discharge booklet - instructions reviewed  Discharge to: Home  Follow up :   [facility] in 6 weeks for routine postpartum visit with DR Cherly Hensenousins Call for cardiology follow-up 2-4 weeks postpartum                Signed: Marlinda MikeBAILEY, TANYA CNM, MSN, [facility]ACNM 05/26/2014, 9:43 AM

## 2014-05-26 NOTE — Progress Notes (Signed)
POSTOPERATIVE DAY # 3 S/P CS  S:         Reports feeling better today - mostly mild soreness this am  - slept well last night             Tolerating po intake / no nausea / no vomiting / + flatus / + BM             Bleeding is light             Pain controlled with motrin and percocet             Up ad lib / ambulatory/ voiding QS  Newborn breast-feeding better / on bili-lights  / Circumcision done   O:  VS: BP 121/61  Pulse 65  Temp(Src) 98 F (36.7 C) (Oral)  Resp 18  Ht 5\' 6"  (1.676 m)  Wt 121.564 kg (268 lb)  BMI 43.28 kg/m2  SpO2 98%  Breastfeeding? Unknown              Bloodtype: --/--/O POS (10/01 1245)  Rubella: Immune (10/01 0000)                     Flu vaccine given 10/1                         Physical Exam:             Alert and Oriented X3  Abdomen: soft, non-tender, non-distended active BS             Fundus: firm, non-tender, U-1             Dressing intact honeycomb              Incision:  approximated with suture / no erythema / no ecchymosis / no drainage  Lochia: small  Extremities: trace edema, no calf pain or tenderness, negative Homans  A:        POD # 3 S/P CS            Mild ABL anemia - stable        P:        Routine postoperative care              DC home - may room-in with newborn if no discharge by Peds today             WOB booklet - instructions reviewed   Marlinda MikeBAILEY, Willella Harding CNM, MSN, [facility] Hospital[facility]ACNM 05/26/2014, 9:36 AM

## 2014-06-24 ENCOUNTER — Encounter (HOSPITAL_COMMUNITY): Payer: Self-pay | Admitting: Obstetrics & Gynecology

## 2020-08-23 NOTE — L&D Delivery Note (Signed)
Delivery Note At 12:36 PM a viable and healthy female was delivered via VBAC, Spontaneous (Presentation: Right Occiput Anterior).  APGAR:6 ,10 ; weight  pending.   Placenta status: Spontaneous, Intact. Not sent. Mec stained Cord:  cord around body3 vessels with the following complications:;Long.  Cord pH: none  Anesthesia: Epidural;Local Episiotomy: None Lacerations: 2nd degree perineal ; bilateral vaginal Sulcus Suture Repair: 3.0 chromic Est. Blood Loss (mL): 500  Mom to postpartum.  Baby to Couplet care / Skin to Skin.  Savio Albrecht A Kailen Name 05/30/2021, 1:28 PM

## 2020-10-30 LAB — OB RESULTS CONSOLE RUBELLA ANTIBODY, IGM: Rubella: IMMUNE

## 2020-10-30 LAB — OB RESULTS CONSOLE HIV ANTIBODY (ROUTINE TESTING): HIV: NONREACTIVE

## 2020-10-30 LAB — OB RESULTS CONSOLE RPR: RPR: NONREACTIVE

## 2020-12-10 ENCOUNTER — Other Ambulatory Visit: Payer: Self-pay | Admitting: Obstetrics and Gynecology

## 2020-12-10 DIAGNOSIS — Z363 Encounter for antenatal screening for malformations: Secondary | ICD-10-CM

## 2020-12-10 DIAGNOSIS — Z3A19 19 weeks gestation of pregnancy: Secondary | ICD-10-CM

## 2020-12-10 DIAGNOSIS — O09522 Supervision of elderly multigravida, second trimester: Secondary | ICD-10-CM

## 2020-12-24 ENCOUNTER — Encounter: Payer: Self-pay | Admitting: *Deleted

## 2020-12-31 ENCOUNTER — Ambulatory Visit: Payer: Managed Care, Other (non HMO)

## 2020-12-31 ENCOUNTER — Other Ambulatory Visit: Payer: Self-pay

## 2020-12-31 ENCOUNTER — Encounter: Payer: Self-pay | Admitting: *Deleted

## 2020-12-31 ENCOUNTER — Other Ambulatory Visit: Payer: Self-pay | Admitting: *Deleted

## 2020-12-31 ENCOUNTER — Ambulatory Visit (HOSPITAL_BASED_OUTPATIENT_CLINIC_OR_DEPARTMENT_OTHER): Payer: No Typology Code available for payment source

## 2020-12-31 ENCOUNTER — Ambulatory Visit: Payer: No Typology Code available for payment source | Attending: Obstetrics and Gynecology | Admitting: *Deleted

## 2020-12-31 VITALS — BP 127/73 | HR 65

## 2020-12-31 DIAGNOSIS — O09522 Supervision of elderly multigravida, second trimester: Secondary | ICD-10-CM | POA: Insufficient documentation

## 2020-12-31 DIAGNOSIS — Z363 Encounter for antenatal screening for malformations: Secondary | ICD-10-CM | POA: Insufficient documentation

## 2020-12-31 DIAGNOSIS — Z3A19 19 weeks gestation of pregnancy: Secondary | ICD-10-CM

## 2020-12-31 DIAGNOSIS — Z87798 Personal history of other (corrected) congenital malformations: Secondary | ICD-10-CM | POA: Diagnosis not present

## 2020-12-31 DIAGNOSIS — O99212 Obesity complicating pregnancy, second trimester: Secondary | ICD-10-CM | POA: Insufficient documentation

## 2021-01-28 ENCOUNTER — Other Ambulatory Visit: Payer: Self-pay

## 2021-01-28 ENCOUNTER — Ambulatory Visit: Payer: No Typology Code available for payment source | Attending: Obstetrics

## 2021-01-28 ENCOUNTER — Ambulatory Visit: Payer: No Typology Code available for payment source | Admitting: *Deleted

## 2021-01-28 ENCOUNTER — Encounter: Payer: Self-pay | Admitting: *Deleted

## 2021-01-28 VITALS — BP 130/76 | HR 64

## 2021-01-28 DIAGNOSIS — O09522 Supervision of elderly multigravida, second trimester: Secondary | ICD-10-CM

## 2021-01-29 ENCOUNTER — Other Ambulatory Visit: Payer: Self-pay | Admitting: *Deleted

## 2021-01-29 DIAGNOSIS — Z6836 Body mass index (BMI) 36.0-36.9, adult: Secondary | ICD-10-CM

## 2021-03-25 ENCOUNTER — Other Ambulatory Visit: Payer: Self-pay

## 2021-03-25 ENCOUNTER — Ambulatory Visit: Payer: No Typology Code available for payment source | Attending: Obstetrics and Gynecology

## 2021-03-25 ENCOUNTER — Other Ambulatory Visit: Payer: Self-pay | Admitting: *Deleted

## 2021-03-25 ENCOUNTER — Encounter: Payer: Self-pay | Admitting: *Deleted

## 2021-03-25 ENCOUNTER — Ambulatory Visit: Payer: No Typology Code available for payment source | Admitting: *Deleted

## 2021-03-25 VITALS — BP 122/65 | HR 72

## 2021-03-25 DIAGNOSIS — E669 Obesity, unspecified: Secondary | ICD-10-CM

## 2021-03-25 DIAGNOSIS — O99213 Obesity complicating pregnancy, third trimester: Secondary | ICD-10-CM | POA: Diagnosis not present

## 2021-03-25 DIAGNOSIS — Z363 Encounter for antenatal screening for malformations: Secondary | ICD-10-CM | POA: Diagnosis not present

## 2021-03-25 DIAGNOSIS — Z3A31 31 weeks gestation of pregnancy: Secondary | ICD-10-CM | POA: Diagnosis not present

## 2021-03-25 DIAGNOSIS — O09523 Supervision of elderly multigravida, third trimester: Secondary | ICD-10-CM

## 2021-03-25 DIAGNOSIS — O3663X0 Maternal care for excessive fetal growth, third trimester, not applicable or unspecified: Secondary | ICD-10-CM | POA: Diagnosis not present

## 2021-03-25 DIAGNOSIS — O34219 Maternal care for unspecified type scar from previous cesarean delivery: Secondary | ICD-10-CM

## 2021-03-25 DIAGNOSIS — R638 Other symptoms and signs concerning food and fluid intake: Secondary | ICD-10-CM

## 2021-03-25 DIAGNOSIS — Z87798 Personal history of other (corrected) congenital malformations: Secondary | ICD-10-CM

## 2021-03-25 DIAGNOSIS — Z6836 Body mass index (BMI) 36.0-36.9, adult: Secondary | ICD-10-CM

## 2021-04-29 ENCOUNTER — Other Ambulatory Visit: Payer: Self-pay

## 2021-04-29 ENCOUNTER — Ambulatory Visit: Payer: No Typology Code available for payment source | Admitting: *Deleted

## 2021-04-29 ENCOUNTER — Encounter: Payer: Self-pay | Admitting: *Deleted

## 2021-04-29 ENCOUNTER — Ambulatory Visit: Payer: No Typology Code available for payment source | Attending: Maternal & Fetal Medicine

## 2021-04-29 VITALS — BP 119/72 | HR 75

## 2021-04-29 DIAGNOSIS — O09523 Supervision of elderly multigravida, third trimester: Secondary | ICD-10-CM | POA: Insufficient documentation

## 2021-04-29 DIAGNOSIS — O34219 Maternal care for unspecified type scar from previous cesarean delivery: Secondary | ICD-10-CM | POA: Diagnosis not present

## 2021-04-29 DIAGNOSIS — E669 Obesity, unspecified: Secondary | ICD-10-CM

## 2021-04-29 DIAGNOSIS — R638 Other symptoms and signs concerning food and fluid intake: Secondary | ICD-10-CM | POA: Diagnosis present

## 2021-04-29 DIAGNOSIS — O99213 Obesity complicating pregnancy, third trimester: Secondary | ICD-10-CM

## 2021-04-29 DIAGNOSIS — Z3A36 36 weeks gestation of pregnancy: Secondary | ICD-10-CM

## 2021-04-30 LAB — OB RESULTS CONSOLE GBS: GBS: NEGATIVE

## 2021-05-29 ENCOUNTER — Inpatient Hospital Stay (HOSPITAL_COMMUNITY)
Admission: AD | Admit: 2021-05-29 | Discharge: 2021-05-31 | DRG: 807 | Disposition: A | Discharge status: Home / Self Care | Payer: No Typology Code available for payment source | Attending: Obstetrics and Gynecology | Admitting: Obstetrics and Gynecology

## 2021-05-29 ENCOUNTER — Encounter (HOSPITAL_COMMUNITY): Payer: Self-pay | Admitting: Obstetrics and Gynecology

## 2021-05-29 ENCOUNTER — Other Ambulatory Visit: Payer: Self-pay

## 2021-05-29 DIAGNOSIS — Z20822 Contact with and (suspected) exposure to covid-19: Secondary | ICD-10-CM | POA: Diagnosis present

## 2021-05-29 DIAGNOSIS — D509 Iron deficiency anemia, unspecified: Secondary | ICD-10-CM | POA: Diagnosis present

## 2021-05-29 DIAGNOSIS — O34219 Maternal care for unspecified type scar from previous cesarean delivery: Secondary | ICD-10-CM

## 2021-05-29 DIAGNOSIS — O34211 Maternal care for low transverse scar from previous cesarean delivery: Secondary | ICD-10-CM | POA: Diagnosis present

## 2021-05-29 DIAGNOSIS — O480 Post-term pregnancy: Secondary | ICD-10-CM | POA: Diagnosis present

## 2021-05-29 DIAGNOSIS — O134 Gestational [pregnancy-induced] hypertension without significant proteinuria, complicating childbirth: Principal | ICD-10-CM | POA: Diagnosis present

## 2021-05-29 DIAGNOSIS — Z3A40 40 weeks gestation of pregnancy: Secondary | ICD-10-CM

## 2021-05-29 DIAGNOSIS — O9902 Anemia complicating childbirth: Secondary | ICD-10-CM | POA: Diagnosis present

## 2021-05-29 NOTE — MAU Note (Signed)
Pt reports contractions now q 5 minutes, some brownish discharge. Reports good fetal movement.

## 2021-05-30 ENCOUNTER — Other Ambulatory Visit: Payer: Self-pay

## 2021-05-30 ENCOUNTER — Inpatient Hospital Stay (HOSPITAL_COMMUNITY): Payer: No Typology Code available for payment source | Admitting: Anesthesiology

## 2021-05-30 ENCOUNTER — Encounter (HOSPITAL_COMMUNITY): Payer: Self-pay | Admitting: Obstetrics and Gynecology

## 2021-05-30 DIAGNOSIS — O34211 Maternal care for low transverse scar from previous cesarean delivery: Secondary | ICD-10-CM | POA: Diagnosis present

## 2021-05-30 DIAGNOSIS — D509 Iron deficiency anemia, unspecified: Secondary | ICD-10-CM | POA: Diagnosis present

## 2021-05-30 DIAGNOSIS — Z3A40 40 weeks gestation of pregnancy: Secondary | ICD-10-CM | POA: Diagnosis not present

## 2021-05-30 DIAGNOSIS — O34219 Maternal care for unspecified type scar from previous cesarean delivery: Secondary | ICD-10-CM | POA: Diagnosis present

## 2021-05-30 DIAGNOSIS — O480 Post-term pregnancy: Secondary | ICD-10-CM | POA: Diagnosis present

## 2021-05-30 DIAGNOSIS — R030 Elevated blood-pressure reading, without diagnosis of hypertension: Secondary | ICD-10-CM | POA: Diagnosis present

## 2021-05-30 DIAGNOSIS — O9902 Anemia complicating childbirth: Secondary | ICD-10-CM | POA: Diagnosis present

## 2021-05-30 DIAGNOSIS — Z20822 Contact with and (suspected) exposure to covid-19: Secondary | ICD-10-CM | POA: Diagnosis present

## 2021-05-30 DIAGNOSIS — O134 Gestational [pregnancy-induced] hypertension without significant proteinuria, complicating childbirth: Secondary | ICD-10-CM | POA: Diagnosis present

## 2021-05-30 LAB — COMPREHENSIVE METABOLIC PANEL
ALT: 17 U/L (ref 0–44)
AST: 23 U/L (ref 15–41)
Albumin: 2.9 g/dL — ABNORMAL LOW (ref 3.5–5.0)
Alkaline Phosphatase: 103 U/L (ref 38–126)
Anion gap: 8 (ref 5–15)
BUN: 7 mg/dL (ref 6–20)
CO2: 21 mmol/L — ABNORMAL LOW (ref 22–32)
Calcium: 9.2 mg/dL (ref 8.9–10.3)
Chloride: 105 mmol/L (ref 98–111)
Creatinine, Ser: 0.61 mg/dL (ref 0.44–1.00)
GFR, Estimated: >60 mL/min (ref >60)
Glucose, Bld: 91 mg/dL (ref 70–99)
Potassium: 4 mmol/L (ref 3.5–5.1)
Sodium: 134 mmol/L — ABNORMAL LOW (ref 135–145)
Total Bilirubin: 0.2 mg/dL — ABNORMAL LOW (ref 0.3–1.2)
Total Protein: 6.6 g/dL (ref 6.5–8.1)

## 2021-05-30 LAB — PROTEIN / CREATININE RATIO, URINE
Creatinine, Urine: 66.85 mg/dL
Protein Creatinine Ratio: 0.15 mg/mg{Cre} (ref 0.00–0.15)
Total Protein, Urine: 10 mg/dL

## 2021-05-30 LAB — TYPE AND SCREEN
ABO/RH(D): O POS
Antibody Screen: NEGATIVE

## 2021-05-30 LAB — CBC
HCT: 33.2 % — ABNORMAL LOW (ref 36.0–46.0)
Hemoglobin: 10.7 g/dL — ABNORMAL LOW (ref 12.0–15.0)
MCH: 27.4 pg (ref 26.0–34.0)
MCHC: 32.2 g/dL (ref 30.0–36.0)
MCV: 85.1 fL (ref 80.0–100.0)
Platelets: 271 10*3/uL (ref 150–400)
RBC: 3.9 MIL/uL (ref 3.87–5.11)
RDW: 16 % — ABNORMAL HIGH (ref 11.5–15.5)
WBC: 6.6 10*3/uL (ref 4.0–10.5)
nRBC: 0 % (ref 0.0–0.2)

## 2021-05-30 LAB — RESP PANEL BY RT-PCR (FLU A&B, COVID) ARPGX2
Influenza A by PCR: NEGATIVE
Influenza B by PCR: NEGATIVE
SARS Coronavirus 2 by RT PCR: NEGATIVE

## 2021-05-30 LAB — RPR: RPR Ser Ql: NONREACTIVE

## 2021-05-30 MED ORDER — BENZOCAINE-MENTHOL 20-0.5 % EX AERO
1.0000 "application " | INHALATION_SPRAY | CUTANEOUS | Status: DC | PRN
  Administered 2021-05-30: 1 via TOPICAL
  Filled 2021-05-30: qty 56

## 2021-05-30 MED ORDER — OXYTOCIN-SODIUM CHLORIDE 30-0.9 UT/500ML-% IV SOLN
1.0000 m[IU]/min | INTRAVENOUS | Status: DC
  Administered 2021-05-30: 2 m[IU]/min via INTRAVENOUS
  Filled 2021-05-30: qty 500

## 2021-05-30 MED ORDER — OXYTOCIN-SODIUM CHLORIDE 30-0.9 UT/500ML-% IV SOLN: 2.5000 [IU]/h | INTRAVENOUS | Status: DC

## 2021-05-30 MED ORDER — INFLUENZA VAC SPLIT QUAD 0.5 ML IM SUSY: 0.5000 mL | PREFILLED_SYRINGE | INTRAMUSCULAR | Status: DC

## 2021-05-30 MED ORDER — ACETAMINOPHEN 325 MG PO TABS: 650.0000 mg | ORAL_TABLET | ORAL | Status: DC | PRN

## 2021-05-30 MED ORDER — SIMETHICONE 80 MG PO CHEW: 80.0000 mg | CHEWABLE_TABLET | ORAL | Status: DC | PRN

## 2021-05-30 MED ORDER — OXYCODONE HCL 5 MG PO TABS: 5.0000 mg | ORAL_TABLET | ORAL | Status: DC | PRN

## 2021-05-30 MED ORDER — DIPHENHYDRAMINE HCL 50 MG/ML IJ SOLN: 12.5000 mg | INTRAMUSCULAR | Status: DC | PRN

## 2021-05-30 MED ORDER — LIDOCAINE HCL (PF) 1 % IJ SOLN
30.0000 mL | INTRAMUSCULAR | Status: AC | PRN
  Administered 2021-05-30: 30 mL via SUBCUTANEOUS
  Filled 2021-05-30: qty 30

## 2021-05-30 MED ORDER — LACTATED RINGERS IV SOLN
500.0000 mL | INTRAVENOUS | Status: DC | PRN
  Administered 2021-05-30: 500 mL via INTRAVENOUS

## 2021-05-30 MED ORDER — OXYCODONE HCL 5 MG PO TABS: 10.0000 mg | ORAL_TABLET | ORAL | Status: DC | PRN

## 2021-05-30 MED ORDER — ONDANSETRON HCL 4 MG PO TABS: 4.0000 mg | ORAL_TABLET | ORAL | Status: DC | PRN

## 2021-05-30 MED ORDER — IBUPROFEN 600 MG PO TABS
600.0000 mg | ORAL_TABLET | Freq: Four times a day (QID) | ORAL | Status: DC
  Administered 2021-05-30 – 2021-05-31 (×4): 600 mg via ORAL
  Filled 2021-05-30 (×4): qty 1

## 2021-05-30 MED ORDER — PHENYLEPHRINE 40 MCG/ML (10ML) SYRINGE FOR IV PUSH (FOR BLOOD PRESSURE SUPPORT): 80.0000 ug | PREFILLED_SYRINGE | INTRAVENOUS | Status: DC | PRN

## 2021-05-30 MED ORDER — DIBUCAINE (PERIANAL) 1 % EX OINT: 1.0000 "application " | TOPICAL_OINTMENT | CUTANEOUS | Status: DC | PRN

## 2021-05-30 MED ORDER — LIDOCAINE HCL (PF) 1 % IJ SOLN
INTRAMUSCULAR | Status: DC | PRN
  Administered 2021-05-30: 11 mL via EPIDURAL

## 2021-05-30 MED ORDER — EPHEDRINE 5 MG/ML INJ: 10.0000 mg | INTRAVENOUS | Status: DC | PRN

## 2021-05-30 MED ORDER — LACTATED RINGERS AMNIOINFUSION: INTRAVENOUS | Status: DC

## 2021-05-30 MED ORDER — OXYTOCIN BOLUS FROM INFUSION
333.0000 mL | Freq: Once | INTRAVENOUS | Status: AC
  Administered 2021-05-30: 333 mL via INTRAVENOUS

## 2021-05-30 MED ORDER — OXYCODONE-ACETAMINOPHEN 5-325 MG PO TABS: 2.0000 | ORAL_TABLET | ORAL | Status: DC | PRN

## 2021-05-30 MED ORDER — ONDANSETRON HCL 4 MG/2ML IJ SOLN: 4.0000 mg | Freq: Four times a day (QID) | INTRAMUSCULAR | Status: DC | PRN

## 2021-05-30 MED ORDER — SENNOSIDES-DOCUSATE SODIUM 8.6-50 MG PO TABS
2.0000 | ORAL_TABLET | Freq: Every day | ORAL | Status: DC
  Administered 2021-05-31: 2 via ORAL
  Filled 2021-05-30: qty 2

## 2021-05-30 MED ORDER — ONDANSETRON HCL 4 MG/2ML IJ SOLN: 4.0000 mg | INTRAMUSCULAR | Status: DC | PRN

## 2021-05-30 MED ORDER — TERBUTALINE SULFATE 1 MG/ML IJ SOLN: 0.2500 mg | Freq: Once | INTRAMUSCULAR | Status: DC | PRN

## 2021-05-30 MED ORDER — OXYTOCIN 10 UNIT/ML IJ SOLN: 10.0000 [IU] | Freq: Once | INTRAMUSCULAR | Status: DC

## 2021-05-30 MED ORDER — SOD CITRATE-CITRIC ACID 500-334 MG/5ML PO SOLN: 30.0000 mL | ORAL | Status: DC | PRN

## 2021-05-30 MED ORDER — ZOLPIDEM TARTRATE 5 MG PO TABS: 5.0000 mg | ORAL_TABLET | Freq: Every evening | ORAL | Status: DC | PRN

## 2021-05-30 MED ORDER — WITCH HAZEL-GLYCERIN EX PADS: 1.0000 "application " | MEDICATED_PAD | CUTANEOUS | Status: DC | PRN

## 2021-05-30 MED ORDER — FENTANYL-BUPIVACAINE-NACL 0.5-0.125-0.9 MG/250ML-% EP SOLN
12.0000 mL/h | EPIDURAL | Status: DC | PRN
  Administered 2021-05-30: 12 mL/h via EPIDURAL
  Filled 2021-05-30: qty 250

## 2021-05-30 MED ORDER — LACTATED RINGERS IV SOLN: INTRAVENOUS | Status: DC

## 2021-05-30 MED ORDER — DIPHENHYDRAMINE HCL 25 MG PO CAPS: 25.0000 mg | ORAL_CAPSULE | Freq: Four times a day (QID) | ORAL | Status: DC | PRN

## 2021-05-30 MED ORDER — PRENATAL MULTIVITAMIN CH
1.0000 | ORAL_TABLET | Freq: Every day | ORAL | Status: DC
  Administered 2021-05-31: 1 via ORAL
  Filled 2021-05-30: qty 1

## 2021-05-30 MED ORDER — COCONUT OIL OIL: 1.0000 "application " | TOPICAL_OIL | Status: DC | PRN

## 2021-05-30 MED ORDER — OXYCODONE-ACETAMINOPHEN 5-325 MG PO TABS: 1.0000 | ORAL_TABLET | ORAL | Status: DC | PRN

## 2021-05-30 MED ORDER — LACTATED RINGERS IV SOLN: 500.0000 mL | Freq: Once | INTRAVENOUS | Status: DC

## 2021-05-30 MED ORDER — FERROUS SULFATE 325 (65 FE) MG PO TABS
325.0000 mg | ORAL_TABLET | Freq: Two times a day (BID) | ORAL | Status: DC
  Administered 2021-05-30 – 2021-05-31 (×2): 325 mg via ORAL
  Filled 2021-05-30 (×2): qty 1

## 2021-05-30 NOTE — Anesthesia Procedure Notes (Signed)
Epidural Patient location during procedure: OB Start time: 05/30/2021 7:28 AM End time: 05/30/2021 7:44 AM  Staffing Anesthesiologist: Lowella Curb, MD Performed: anesthesiologist   Preanesthetic Checklist Completed: patient identified, IV checked, site marked, risks and benefits discussed, surgical consent, monitors and equipment checked, pre-op evaluation and timeout performed  Epidural Patient position: sitting Prep: ChloraPrep Patient monitoring: heart rate, cardiac monitor, continuous pulse ox and blood pressure Approach: midline Location: L2-L3 Injection technique: LOR saline  Needle:  Needle type: Tuohy  Needle gauge: 17 G Needle length: 9 cm Needle insertion depth: 7 cm Catheter type: closed end flexible Catheter size: 20 Guage Catheter at skin depth: 11 cm Test dose: negative  Assessment Events: blood not aspirated, injection not painful, no injection resistance, no paresthesia and negative IV test  Additional Notes Reason for block:procedure for pain

## 2021-05-30 NOTE — Anesthesia Preprocedure Evaluation (Signed)
Anesthesia Evaluation  Patient identified by MRN, date of birth, ID band Patient awake    Reviewed: Allergy & Precautions, H&P , Patient's Chart, lab work & pertinent test results  History of Anesthesia Complications (+) PONV  Airway Mallampati: I  TM Distance: >3 FB Neck ROM: full    Dental no notable dental hx.    Pulmonary    Pulmonary exam normal breath sounds clear to auscultation       Cardiovascular Exercise Tolerance: Good + Valvular Problems/Murmurs (s/p ASD repair)  Rhythm:regular Rate:Normal     Neuro/Psych    GI/Hepatic   Endo/Other    Renal/GU      Musculoskeletal   Abdominal   Peds  Hematology   Anesthesia Other Findings   Reproductive/Obstetrics (+) Pregnancy (urgent C/S for BPP 2/10)                             Anesthesia Physical  Anesthesia Plan  ASA: 2  Anesthesia Plan: Epidural   Post-op Pain Management:    Induction:   PONV Risk Score and Plan:   Airway Management Planned:   Additional Equipment:   Intra-op Plan:   Post-operative Plan:   Informed Consent: I have reviewed the patients History and Physical, chart, labs and discussed the procedure including the risks, benefits and alternatives for the proposed anesthesia with the patient or authorized representative who has indicated his/her understanding and acceptance.     Dental Advisory Given  Plan Discussed with:   Anesthesia Plan Comments:         Anesthesia Quick Evaluation

## 2021-05-30 NOTE — Progress Notes (Addendum)
S: comfortable Epidural  O: BP 137/85   Pulse 78   Temp 97.8 F (36.6 C) (Oral)   Resp 18   Ht 5' 6.5" (1.689 m)   Wt 57.6 kg   LMP 08/19/2020   SpO2 100%   BMI 20.19 kg/m  Pitocin 4 miu VE 6/90/-1 asynclitic AROM mod mec fluid  IUPC/ISE place  Tracing: baseline 120 (+)accel 150 (+) decel  down to 60-70's x now back to baseline. Subsequent variable decelerations with contractions (+) good variability  Ctx q 3 mins  IMP: active phase Previous LTCS desires VBAC4  Post dates Gestational HTN Variable declerations P) amnioinfusion due to  variable decel. Flying cowgirl position. Pause Pitocin

## 2021-05-30 NOTE — Anesthesia Postprocedure Evaluation (Signed)
Anesthesia Post Note  Patient: Sheri Franklin  Procedure(s) Performed: AN AD HOC LABOR EPIDURAL     Patient location during evaluation: Mother Baby Anesthesia Type: Epidural Level of consciousness: awake and alert Pain management: pain level controlled Vital Signs Assessment: post-procedure vital signs reviewed and stable Respiratory status: spontaneous breathing, nonlabored ventilation and respiratory function stable Cardiovascular status: stable Postop Assessment: no headache, no backache and epidural receding Anesthetic complications: no   No notable events documented.  Last Vitals:  Vitals:   05/30/21 1650 05/30/21 1700  BP: (!) 142/68 127/68  Pulse: 72 82  Resp: 16 16  Temp: 36.8 C   SpO2: 100%     Last Pain:  Vitals:   05/30/21 1650  TempSrc: Oral  PainSc: 2    Pain Goal: Patients Stated Pain Goal: 7 (05/29/21 2247)              Epidural/Spinal Function Cutaneous sensation: Normal sensation (05/30/21 1650), Patient able to flex knees: Yes (05/30/21 1650), Patient able to lift hips off bed: Yes (05/30/21 1650), Back pain beyond tenderness at insertion site: No (05/30/21 1650), Progressively worsening motor and/or sensory loss: No (05/30/21 1650), Bowel and/or bladder incontinence post epidural: No (05/30/21 1650)  Rica Records

## 2021-05-30 NOTE — H&P (Addendum)
Sheri Franklin is a 35 y.o. female presenting@ 40 3/[redacted] wk gestation in latent phase labor. Pt on presentation to MAU for labor check was found to have elevated BP. No hx HTN. GBS cx neg No other PIH warning signs. OB History     Gravida  2   Para  1   Term  1   Preterm      AB      Living  1      SAB      IAB      Ectopic      Multiple      Live Births  1          History reviewed. No pertinent past medical history. Past Surgical History:  Procedure Laterality Date   ASD REPAIR     CESAREAN SECTION N/A 05/23/2014   Procedure: CESAREAN SECTION;  Surgeon: Genia Del, MD;  Location: [facility];  Service: Obstetrics;  Laterality: N/A;   Family History: family history includes Cancer in her maternal grandfather; Diabetes in her mother; Hypertension in her father and mother; Stroke in her father. Social History:  reports that she has never smoked. She has never used smokeless tobacco. She reports that she does not currently use alcohol. She reports that she does not use drugs.     Maternal Diabetes: No Genetic Screening: Declined Maternal Ultrasounds/Referrals: Normal Fetal Ultrasounds or other Referrals:  Fetal echo nl. Due to maternal hx ASD repair Maternal Substance Abuse:  No Significant Maternal Medications:  None Significant Maternal Lab Results:  Group B Strep negative Other Comments:   previous LTCS. Hx ASD repair  Review of Systems History Dilation: 5.5 Effacement (%): 90 Station: -1 Exam by:: Lajuana Matte, RNC Blood pressure 137/85, pulse 78, temperature 97.8 F (36.6 C), temperature source Oral, resp. rate 18, height 5' 6.5" (1.689 m), weight 57.6 kg, last menstrual period 08/19/2020, SpO2 100 %, unknown if currently breastfeeding. Maternal Exam:  Introitus: Normal vulva.  Physical Exam Constitutional:      Appearance: Normal appearance.  Eyes:     Extraocular Movements: Extraocular movements intact.  Cardiovascular:     Rate and Rhythm:  Regular rhythm.     Heart sounds: Normal heart sounds.     Comments: Ant chest scar( vertical) Pulmonary:     Breath sounds: Normal breath sounds.  Abdominal:     Palpations: Abdomen is soft.     Comments: gravid  Genitourinary:    General: Normal vulva.  Musculoskeletal:        General: Normal range of motion.     Cervical back: Neck supple.  Skin:    General: Skin is warm and dry.  Neurological:     General: No focal deficit present.     Mental Status: She is alert and oriented to person, place, and time.  Psychiatric:        Mood and Affect: Mood normal.        Behavior: Behavior normal.    Prenatal labs: ABO, Rh: --/--/O POS (10/08 0042) Antibody: NEG (10/08 0042) Rubella: Immune (03/10 0000) RPR: Nonreactive (03/10 0000)  HBsAg:   negative HIV: Non-reactive (03/10 0000)  GBS: Negative/-- (09/08 0000)  HCV neg  CBC Latest Ref Rng & Units 05/30/2021 05/24/2014 05/23/2014  WBC 4.0 - 10.5 K/uL 6.6 10.1 6.8  Hemoglobin 12.0 - 15.0 g/dL 10.7(L) 10.0(L) 13.0  Hematocrit 36.0 - 46.0 % 33.2(L) 29.2(L) 37.8  Platelets 150 - 400 K/uL 271 184 217    CMP Latest  Ref Rng & Units 05/30/2021 02/14/2014  Glucose 70 - 99 mg/dL 91 161(W)  BUN 6 - 20 mg/dL 7 6  Creatinine 9.60 - 1.00 mg/dL 4.54 0.98  Sodium 119 - 145 mmol/L 134(L) 136(L)  Potassium 3.5 - 5.1 mmol/L 4.0 3.8  Chloride 98 - 111 mmol/L 105 102  CO2 22 - 32 mmol/L 21(L) 21  Calcium 8.9 - 10.3 mg/dL 9.2 9.4  Total Protein 6.5 - 8.1 g/dL 6.6 -  Total Bilirubin 0.3 - 1.2 mg/dL 1.4(N) -  Alkaline Phos 38 - 126 U/L 103 -  AST 15 - 41 U/L 23 -  ALT 0 - 44 U/L 17 -   PCR 0.15 Assessment/Plan: Gestational HTN Latent phase  Previous LTCS desires VBAC Postdates Desires sterilization P) admit. PIH labs done. Pitocin augmentation. Epidural. Amniotomy  prn   Jennings Corado A Jamilex Bohnsack 05/30/2021, 9:30 AM

## 2021-05-31 LAB — CBC
HCT: 24.6 % — ABNORMAL LOW (ref 36.0–46.0)
Hemoglobin: 7.9 g/dL — ABNORMAL LOW (ref 12.0–15.0)
MCH: 27 pg (ref 26.0–34.0)
MCHC: 32.1 g/dL (ref 30.0–36.0)
MCV: 84 fL (ref 80.0–100.0)
Platelets: 221 10*3/uL (ref 150–400)
RBC: 2.93 MIL/uL — ABNORMAL LOW (ref 3.87–5.11)
RDW: 16.3 % — ABNORMAL HIGH (ref 11.5–15.5)
WBC: 13.1 10*3/uL — ABNORMAL HIGH (ref 4.0–10.5)
nRBC: 0 % (ref 0.0–0.2)

## 2021-05-31 MED ORDER — IBUPROFEN 600 MG PO TABS: 600.0000 mg | ORAL_TABLET | Freq: Four times a day (QID) | ORAL | 11 refills | Status: AC | PRN

## 2021-05-31 NOTE — Discharge Summary (Signed)
Postpartum Discharge Summary  Date of Service updated     Patient Name: Sheri Franklin DOB: 06-05-1986 MRN: [id number]  Date of admission: 05/29/2021 Delivery date:05/30/2021  Delivering provider: Delisha Peaden  Date of discharge: 05/31/2021  Admitting diagnosis: Gestational HTN, latent phase labor, Postdates. Previous Cesarean section desiring TOLAC Intrauterine pregnancy: [redacted]w[redacted]d    Secondary diagnosis:  IDA Additional problems:   AMA   Discharge diagnosis:  post dates delivered, previous cesarean section, gestational HTN, IDA                                               Post partum procedures: none Augmentation: AROM and Pitocin Complications: None  Hospital course: Onset of Labor With Vaginal Delivery      35y.o. yo G2P1001 at 47w5das admitted in Latent Labor , gestational HTN on 05/29/2021. Patient had an uncomplicated labor course as follows:  Membrane Rupture Time/Date: 9:22 AM ,05/30/2021   moderate meconium. Variable decelerations resulting in amnioinfusion Delivery Method:VBAC, Spontaneous  Episiotomy: None  Lacerations:  2nd deg perineal, bilateral vaginal sulcus Patient had an uncomplicated postpartum course.  She is ambulating, tolerating a regular diet, passing flatus, and urinating well. Patient is discharged home in stable condition on 05/31/21.  Newborn Data: Birth date:05/30/2021  Birth time:12:36 PM  Gender:Female  Living status:Living  Apgars:6 ,10  Weight:4014 g   Magnesium Sulfate received: No BMZ received: No Rhophylac:No MMR:No T-DaP:Given prenatally Flu: Yes Transfusion:No  Physical exam  Vitals:   05/30/21 2050 05/31/21 0049 05/31/21 0102 05/31/21 0450  BP: 130/75 (!) 143/67 131/70 130/84  Pulse: 79 71  66  Resp: _0 Temp: 98.2 F (36.8 C) 98.3 F (36.8 C)  98.3 F (36.8 C)  TempSrc: Oral Oral  Oral  SpO2: 100% 100%  100%  Weight:      Height:       General: alert, cooperative, and no distress Lochia:  appropriate Uterine Fundus: firm Incision: N/A DVT Evaluation: No significant calf/ankle edema. Calf/Ankle edema is present Labs: Lab Results  Component Value Date   WBC 13.1 (H) 05/31/2021   HGB 7.9 (L) 05/31/2021   HCT 24.6 (L) 05/31/2021   MCV 84.0 05/31/2021   PLT 221 05/31/2021   CMP Latest Ref Rng & Units 05/30/2021  Glucose 70 - 99 mg/dL 91  BUN 6 - 20 mg/dL 7  Creatinine 0.44 - 1.00 mg/dL 0.61  Sodium 135 - 145 mmol/L 134(L)  Potassium 3.5 - 5.1 mmol/L 4.0  Chloride 98 - 111 mmol/L 105  CO2 22 - 32 mmol/L 21(L)  Calcium 8.9 - 10.3 mg/dL 9.2  Total Protein 6.5 - 8.1 g/dL 6.6  Total Bilirubin 0.3 - 1.2 mg/dL 0.2(L)  Alkaline Phos 38 - 126 U/L 103  AST 15 - 41 U/L 23  ALT 0 - 44 U/L 17   Edinburgh Score: No flowsheet data found.    After visit meds:  Allergies as of 05/31/2021       Reactions   Doxycycline Rash   Flagyl [metronidazole] Rash   Penicillins Rash        Medication List     TAKE these medications    ibuprofen 600 MG tablet Commonly known as: ADVIL Take 1 tablet (600 mg total) by mouth every 6 (six) hours as needed for moderate pain.   PRENATAL VITAMINS PO Take  1 tablet by mouth daily.         Discharge home in stable condition Infant Feeding: Breast Infant Disposition:home with mother Discharge instruction: per After Visit Summary and Postpartum booklet. Activity: Advance as tolerated. Pelvic rest for 6 weeks.  Diet: routine diet and iron rich diet Anticipated Birth Control: IUD Postpartum Appointment:6 weeks Additional Postpartum F/U:  n/a Future Appointments:No future appointments. Follow up Visit:      05/31/2021 Marvene Staff, MD

## 2021-05-31 NOTE — Progress Notes (Signed)
PPD1 SVD:   S:  Pt reports feeling well. Request early discharge/ Tolerating po/ Voiding without problems/ No n/v/ Bleeding is moderate. Per pt it has slowed down / Pain controlled withprescription NSAID's including ibuprofen (Motrin)  Newborn info live female. BRF well   O:  A & O x 3 / VS: Blood pressure 130/84, pulse 66, temperature 98.3 F (36.8 C), temperature source Oral, resp. rate 18, height 5' 6.5" (1.689 m), weight 57.6 kg, last menstrual period 08/19/2020, SpO2 100 %, unknown if currently breastfeeding.  LABS:  Results for orders placed or performed during the hospital encounter of 05/29/21 (from the past 24 hour(s))  CBC     Status: Abnormal   Collection Time: 05/31/21  4:41 AM  Result Value Ref Range   WBC 13.1 (H) 4.0 - 10.5 K/uL   RBC 2.93 (L) 3.87 - 5.11 MIL/uL   Hemoglobin 7.9 (L) 12.0 - 15.0 g/dL   HCT 16.1 (L) 09.6 - 04.5 %   MCV 84.0 80.0 - 100.0 fL   MCH 27.0 26.0 - 34.0 pg   MCHC 32.1 30.0 - 36.0 g/dL   RDW 40.9 (H) 81.1 - 91.4 %   Platelets 221 150 - 400 K/uL   nRBC 0.0 0.0 - 0.2 %    I&O: I/O last 3 completed shifts: In: -  Out: 949 [Urine:350; Blood:599]   No intake/output data recorded.  Lungs: chest clear, no wheezing, rales, normal symmetric air entry  Heart: regular rate and rhythm, S1, S2 normal, no murmur, click, rub or gallop  Abdomen: uterus firm 1 FB above umbilicus  Perineum: healing with good reapproximation  Lochia: moderate. Bimanual exam . No clots  Extremities:no redness or tenderness in the calves or thighs, edema tr+    A/P: PPD # 1/ G2P1001  IDA exacerbated  by acute blood loss. Pt on oral iron.  Asx. Declined IV iron infusion  Doing well  Continue routine post partum orders  D/c instructions reviewed  F/u 6 wk Plans IUD for contraception Cont with ferrex supplement

## 2021-05-31 NOTE — Discharge Instructions (Signed)
Call if temperature greater than equal to 100.4, nothing per vagina for 4-6 weeks or severe nausea vomiting, increased incisional pain , drainage or redness in the incision site, no straining with bowel movements, showers no bath °

## 2021-06-09 ENCOUNTER — Telehealth (HOSPITAL_COMMUNITY): Payer: Self-pay | Admitting: *Deleted

## 2021-06-09 NOTE — Telephone Encounter (Signed)
Attempted Hospital Discharge Follow-Up Call.  Left voice mail requesting that patient return RN's phone call.  

## 2021-08-24 IMAGING — US US MFM OB FOLLOW-UP
1 series · 14 of 28 positions shown · non-contrast
Comparison: none

[Series 1: us mfm ob follow-up · 33 acquisitions, 14 frames shown]
[im 2/33]
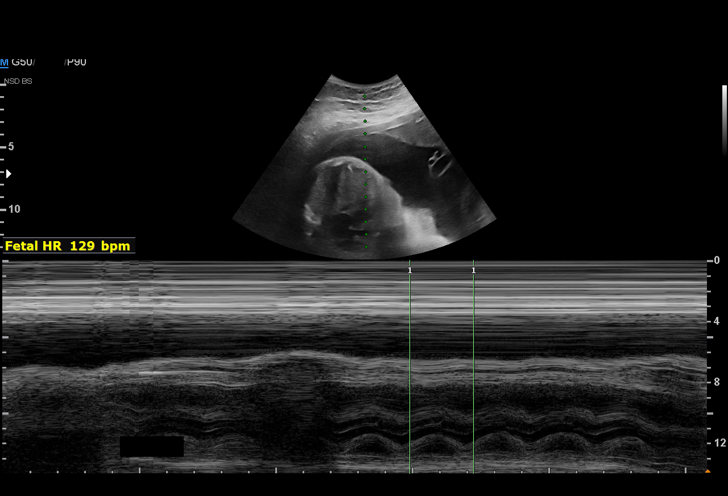
[im 4/33]
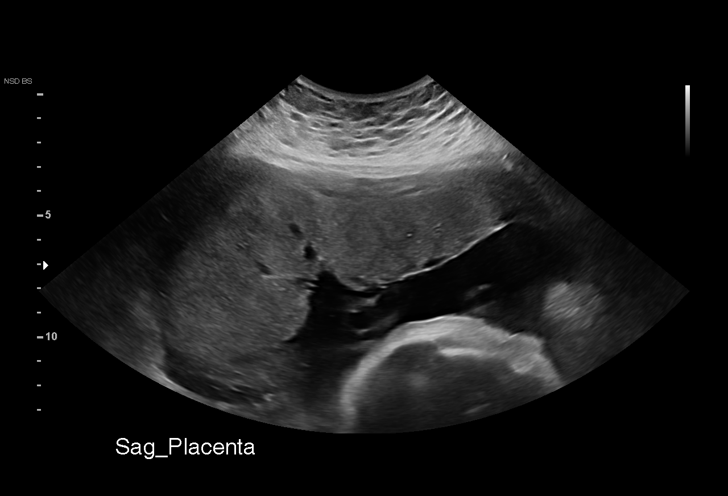
[im 6/33]
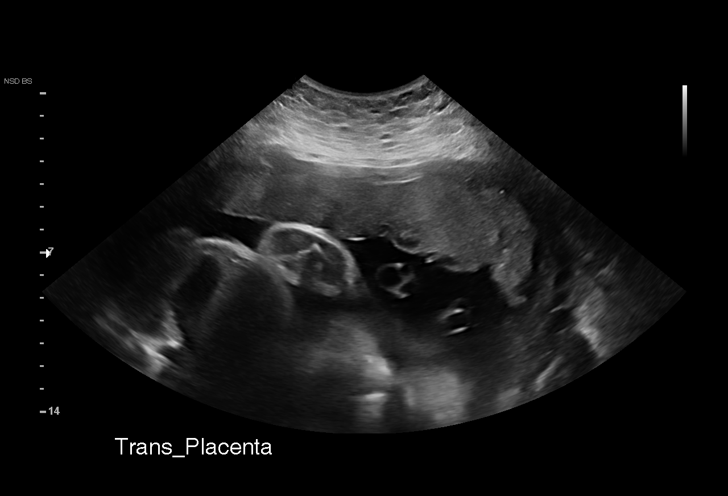
[im 9/33]
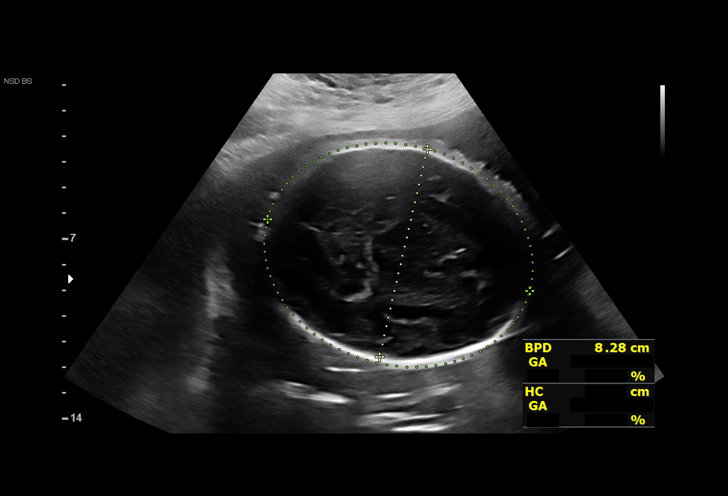
[im 11/33]
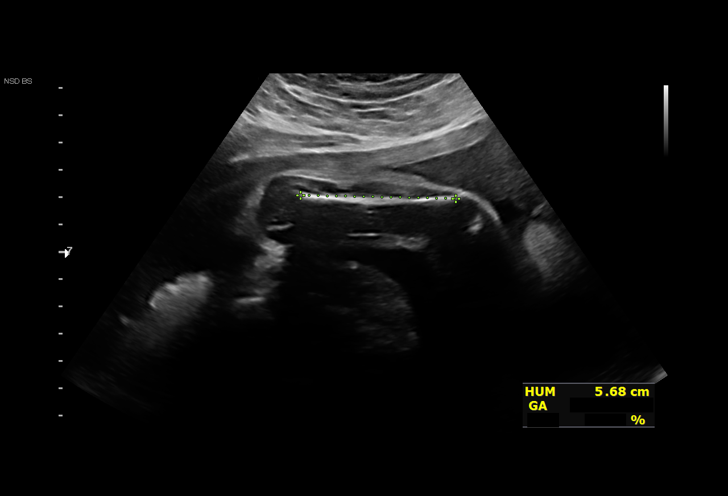
[im 14/33]
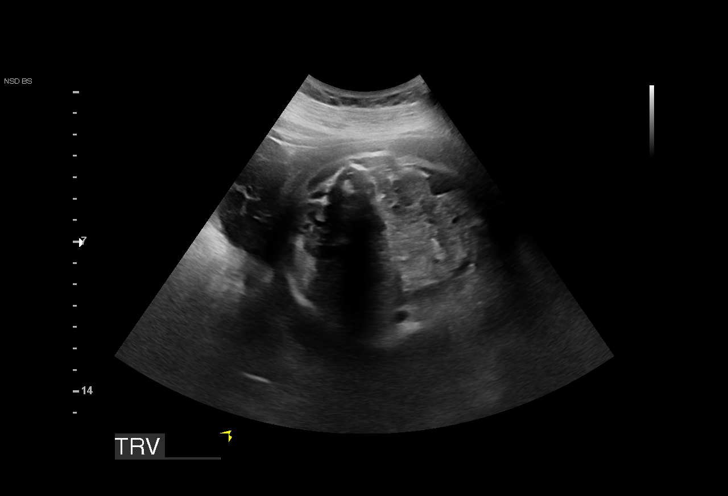
[im 16/33]
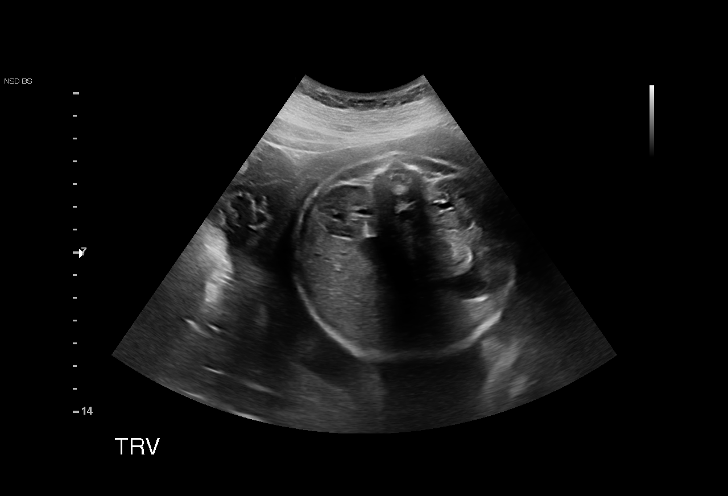
[im 18/33]
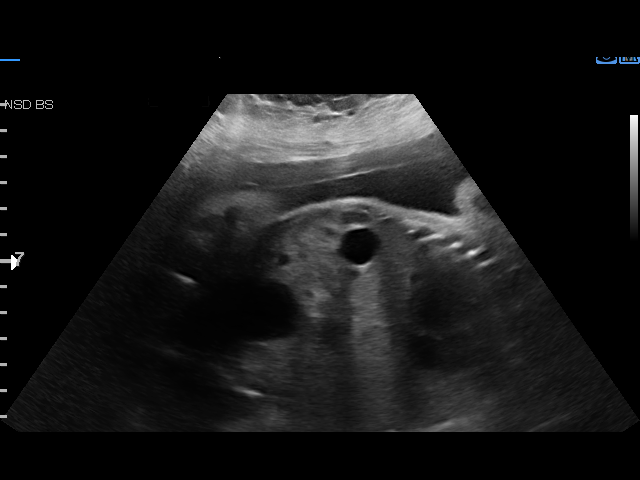
[im 21/33]
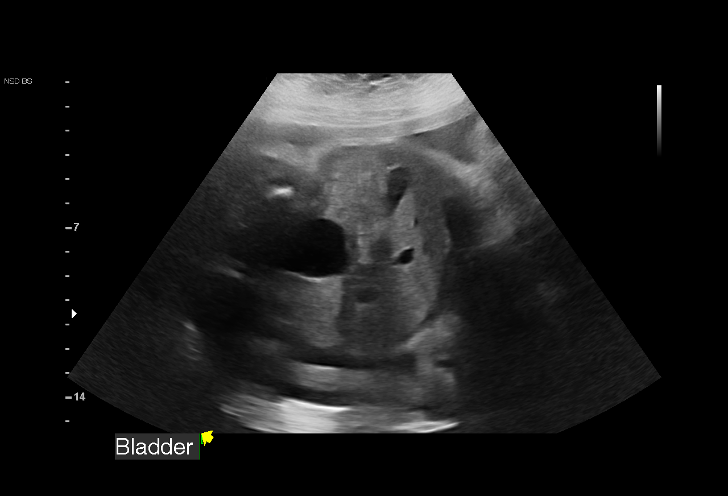
[im 23/33]
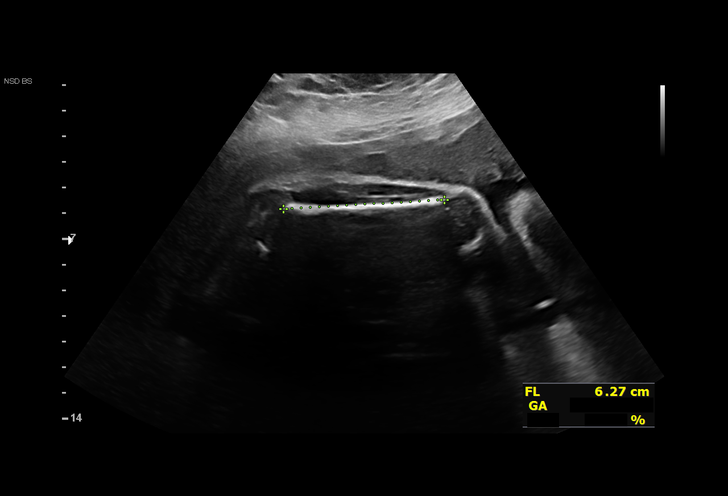
[im 25/33]
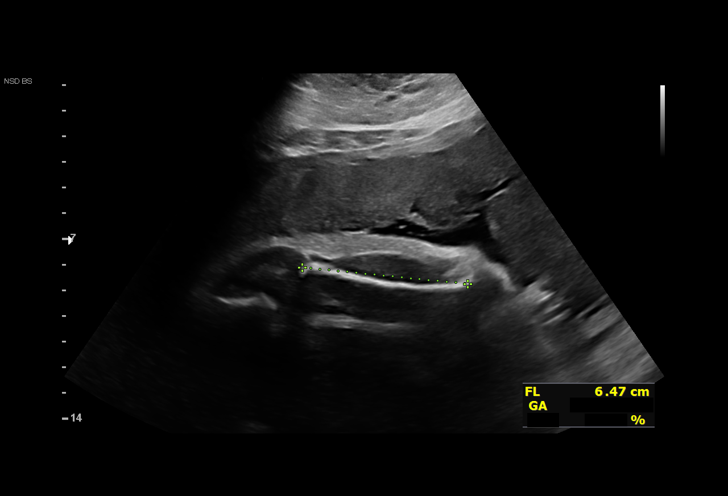
[im 28/33]
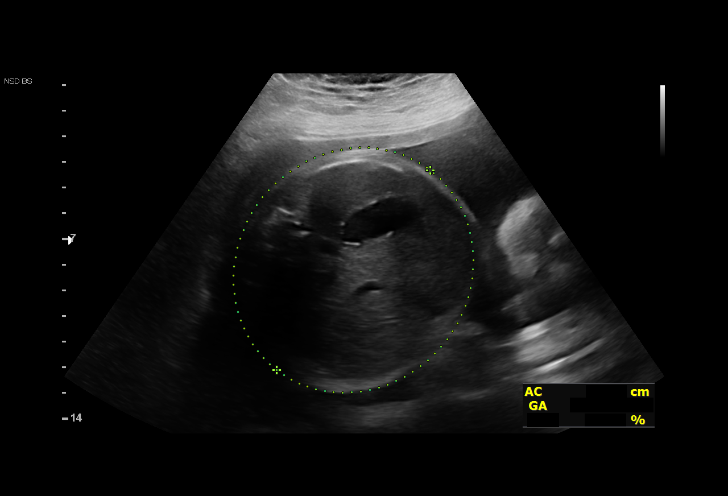
[im 30/33]
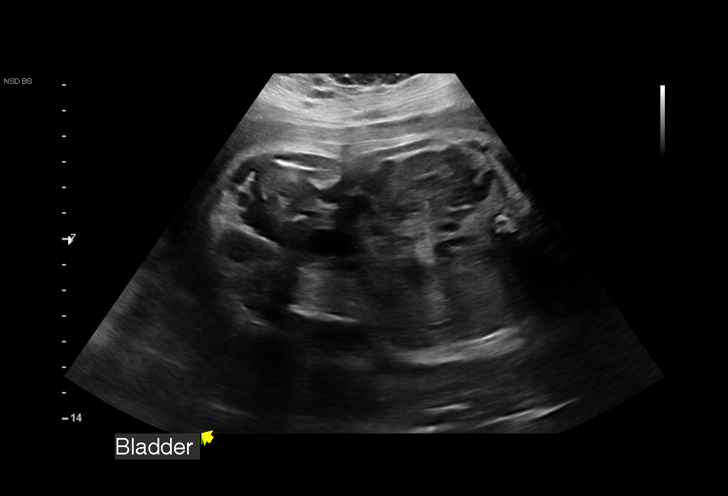
[im 33/33]
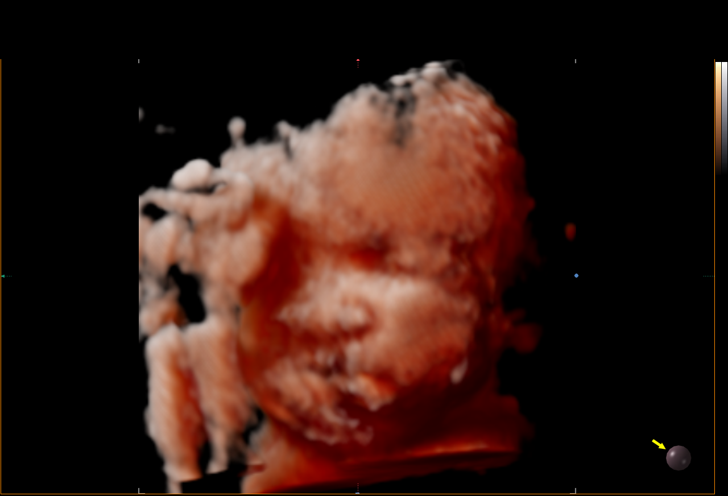

[14 of 28 positions shown; findings below may reference images not displayed]

OB/GYN

Indications

 Advanced maternal age multigravida 35+,
 third trimester
 31 weeks gestation of pregnancy
 Personal history of congenital abnormality
 (ASD requiring surgery)
 Obesity complicating pregnancy, third
 trimester (BMI 36.98)
 Encounter for antenatal screening for
 malformations
 History of cesarean delivery, currently
 pregnant
 LR NIPS - per patient
 NORMAL Fetal Echo
Fetal Evaluation

 Num Of Fetuses:         1
 Fetal Heart Rate(bpm):  129
 Cardiac Activity:       Observed
 Presentation:           Cephalic
 Placenta:               Anterior
 P. Cord Insertion:      Previously seen as normal
 Amniotic Fluid
 AFI FV:      Within normal limits

 AFI Sum(cm)     %Tile       Largest Pocket(cm)
 19.75           75

 RUQ(cm)       RLQ(cm)       LUQ(cm)        LLQ(cm)

Biometry

 BPD:      82.7  mm     G. Age:  33w 2d         92  %    CI:        73.75   %    70 - 86
                                                         FL/HC:      20.8   %    19.3 -
 HC:      305.9  mm     G. Age:  34w 0d         88  %    HC/AC:      1.04        0.96 -
 AC:      294.5  mm     G. Age:  33w 3d         96  %    FL/BPD:     77.0   %    71 - 87
 FL:       63.7  mm     G. Age:  32w 6d         82  %    FL/AC:      21.6   %    20 - 24
 HUM:      56.2  mm     G. Age:  32w 5d         83  %
 LV:        5.8  mm

 Est. FW:    4364  gm    4 lb 13 oz      96  %
OB History

 Gravidity:    2         Term:   1
Gestational Age

 LMP:           31w 1d        Date:  08/19/20                 EDD:   05/26/21
 U/S Today:     33w 3d                                        EDD:   05/10/21
 Best:          31w 1d     Det. By:  LMP  (08/19/20)          EDD:   05/26/21
Anatomy

 Cranium:               Appears normal         LVOT:                   Previously seen
 Cavum:                 Previously seen        Aortic Arch:            Previously seen
 Ventricles:            Appears normal         Ductal Arch:            Previously seen
 Choroid Plexus:        Previously seen        Diaphragm:              Appears normal
 Cerebellum:            Previously seen        Stomach:                Appears normal, left
                                                                       sided
 Posterior Fossa:       Previously seen        Abdomen:                Appears normal
 Nuchal Fold:           Previously seen        Abdominal Wall:         Previously seen
 Face:                  Orbits and profile     Cord Vessels:           Previously seen
                        previously seen
 Lips:                  Previously seen        Kidneys:                Appear normal
 Palate:                Not well visualized    Bladder:                Appears normal
 Thoracic:              Appears normal         Spine:                  Previously seen
 Heart:                 Previously seen        Upper Extremities:      Previously seen
 RVOT:                  Previously seen        Lower Extremities:      Previously seen

 Other:  Female gender previously seen. Heels/feet and open hands/5th digits
         previously visualized. VC, 3VV and 3VTV previously visualized.
         Technically difficult due to maternal habitus.
Cervix Uterus Adnexa

 Cervix
 Not visualized (advanced GA >21wks)
Impression

 Follow up growth due to advanced maternal age and large for
 gestational age.
 Normal interval growth with measurements consistent with
 LGA (EFW 96% and AC at the 96th)
 Good fetal movement and amniotic fluid volume

 I discussed today's findings with Ms. Ronlor given the EFW
 at the 96th% we have scheduled Ms. Ronlor to return in 5
 weeks to gather information for delivery planning.
Recommendations

 Follow up growth in 5 weeks

## 2021-09-28 IMAGING — US US MFM OB FOLLOW-UP
1 series · 12 of 23 positions shown · non-contrast
Comparison: none

[Series 1: us mfm ob follow-up · 12 of 23 slices shown]
[im 1/23]
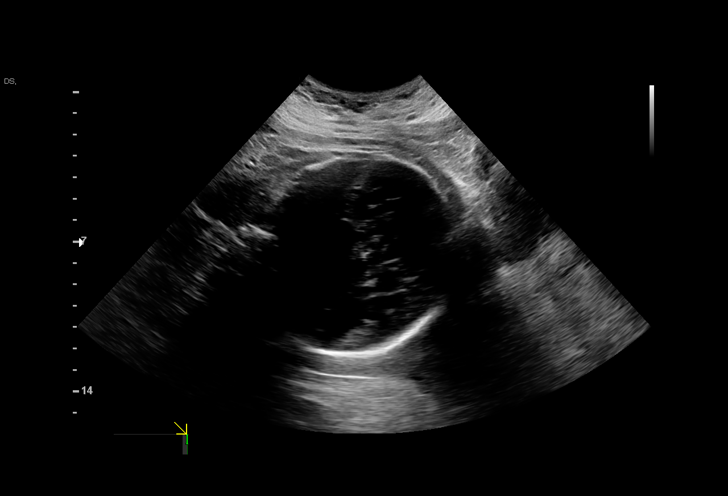
[im 3/23]
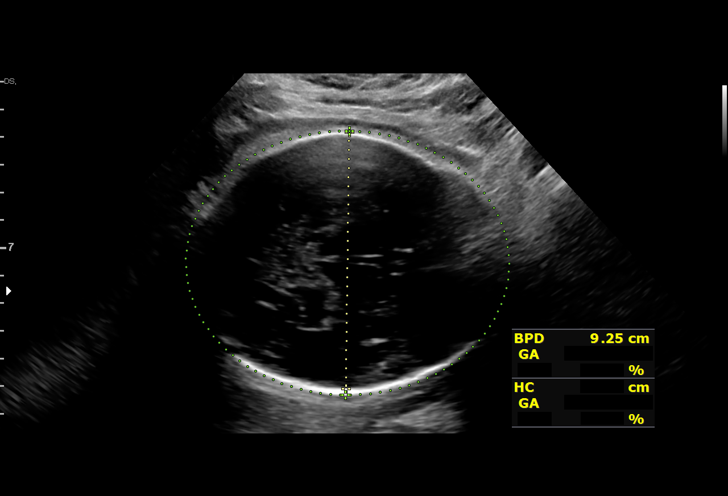
[im 5/23]
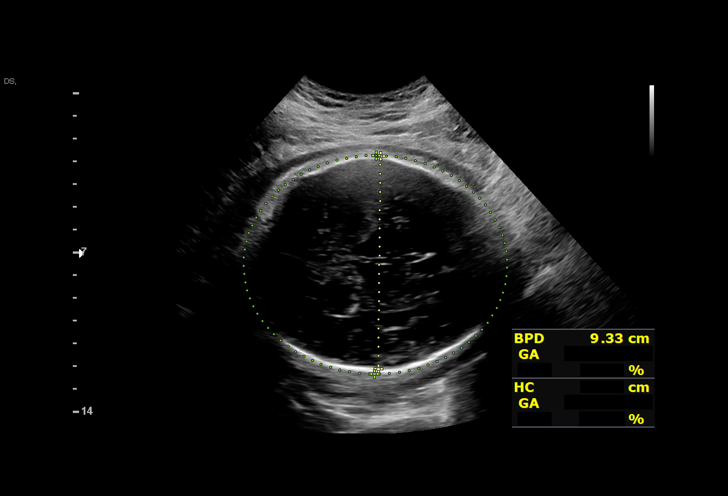
[im 7/23]
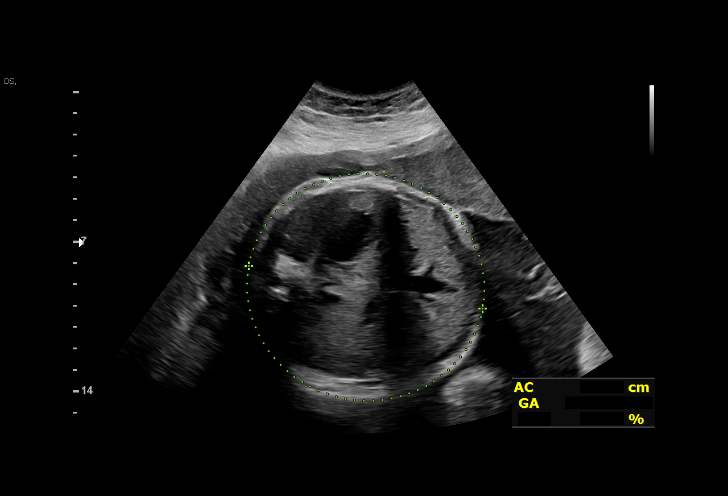
[im 9/23]
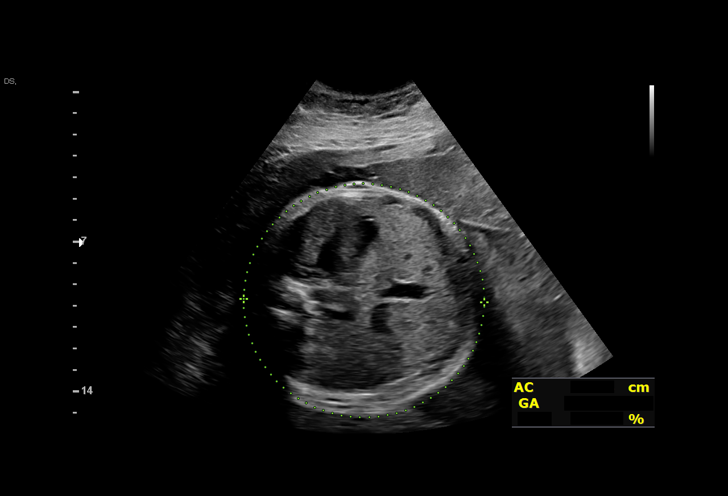
[im 11/23]
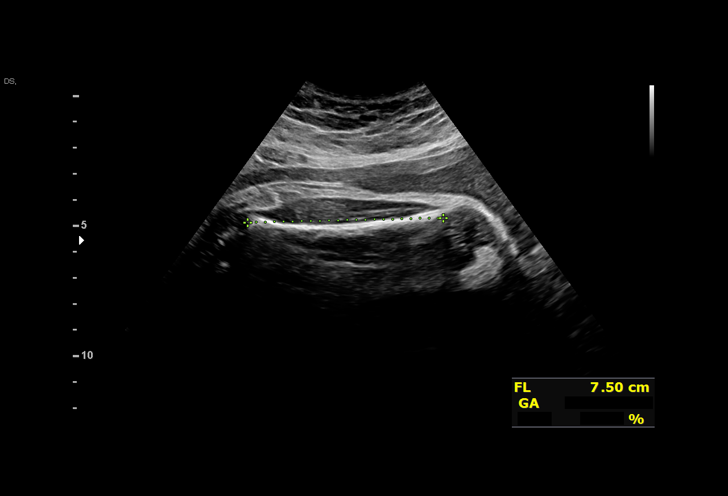
[im 13/23]
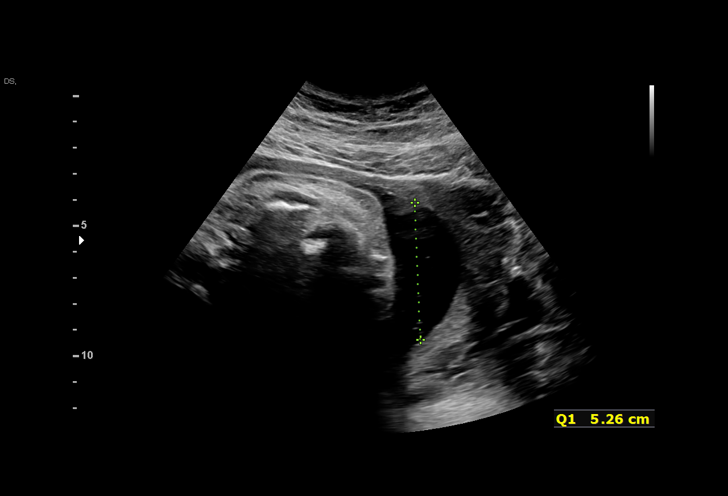
[im 15/23]
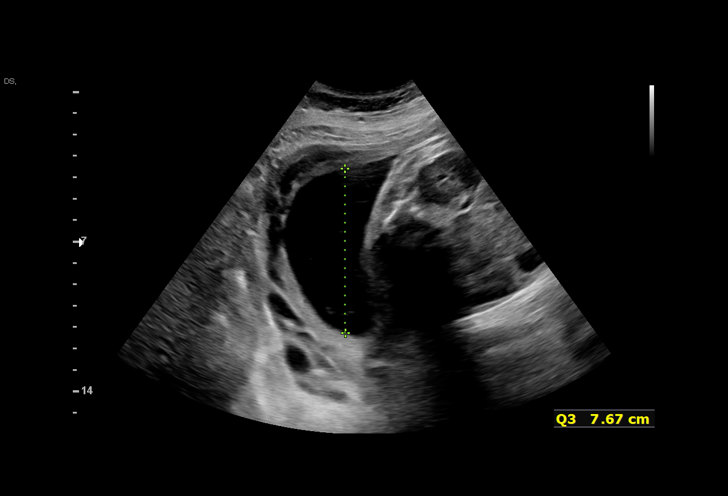
[im 17/23]
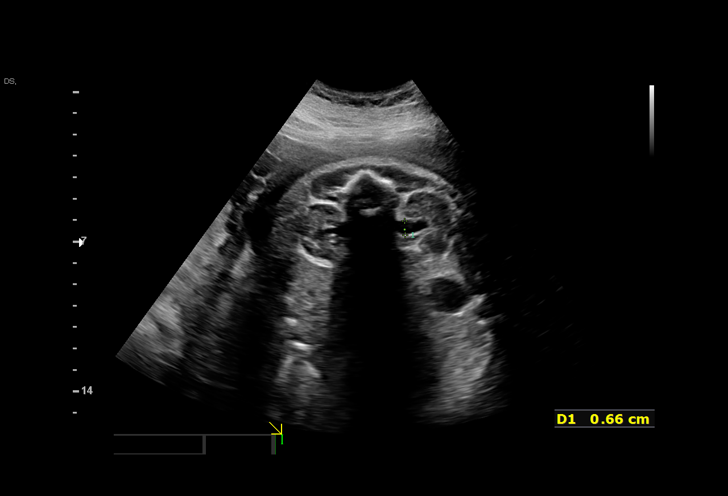
[im 19/23]
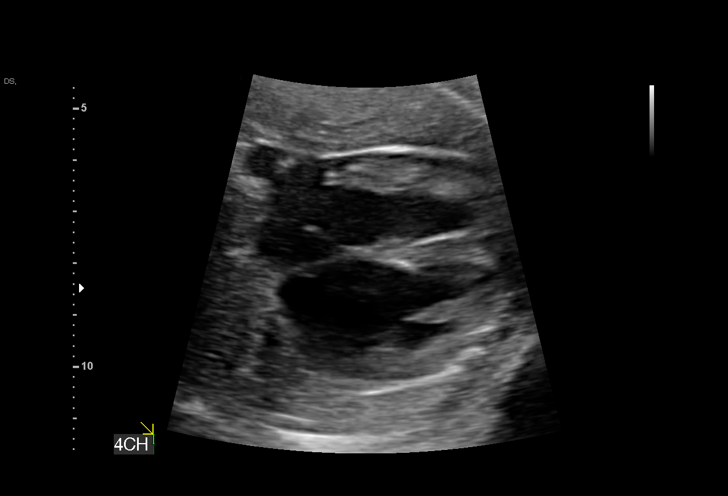
[im 21/23]
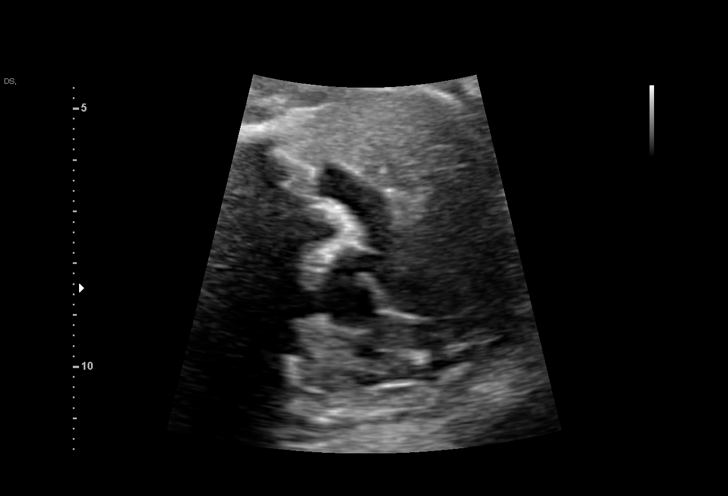
[im 23/23]
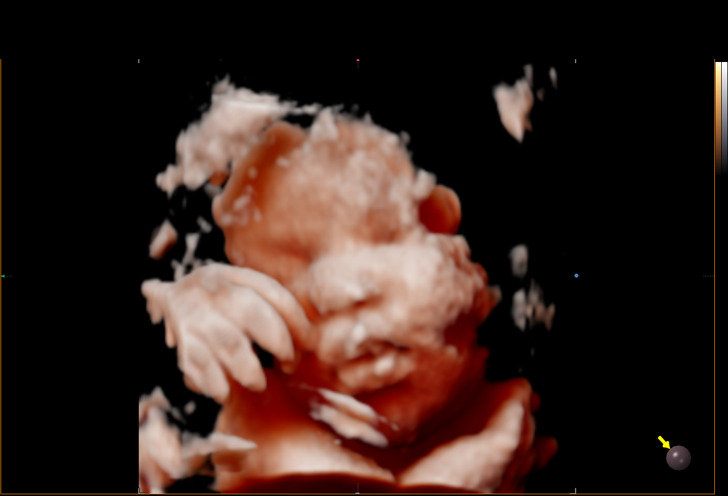

[12 of 23 positions shown; findings below may reference images not displayed]

Addendum:\.br----------------------------------------------------------------------

                                                            OB/GYN

                                                      SKLENAROVA

Indications

 Advanced maternal age multigravida 35+,
 third trimester
 Personal history of congenital abnormality
 (ASD requiring surgery)
 Obesity complicating pregnancy, third
 trimester (BMI 36.98)
 Encounter for antenatal screening for
 malformations
 History of cesarean delivery, currently
 pregnant
 36 weeks gestation of pregnancy
 LR NIPS - per patient
 NORMAL Fetal Echo
Fetal Evaluation

 Num Of Fetuses:         1
 Cardiac Activity:       Observed
 Presentation:           Cephalic
 Placenta:               Anterior
 P. Cord Insertion:      Previously Visualized
 Amniotic Fluid
 AFI FV:      Subjectively upper-normal

 AFI Sum(cm)     %Tile       Largest Pocket(cm)
 24.2            92

 RUQ(cm)       RLQ(cm)       LUQ(cm)        LLQ(cm)

Biometry

 BPD:      92.7  mm     G. Age:  37w 5d         91  %    CI:        76.86   %    70 - 86
                                                         FL/HC:      22.3   %    20.1 -
 HC:      334.9  mm     G. Age:  38w 2d         73  %    HC/AC:      0.97        0.93 -
 AC:      344.5  mm     G. Age:  38w 2d         97  %    FL/BPD:     80.7   %    71 - 87
 FL:       74.8  mm     G. Age:  38w 2d         91  %    FL/AC:      21.7   %    20 - 24

 LV:        7.7  mm

 Est. FW:    6669  gm      7 lb 9 oz     95  %
OB History

 Gravidity:    2         Term:   1
Gestational Age

 LMP:           36w 1d        Date:  08/19/20                 EDD:   05/26/21
 U/S Today:     38w 1d                                        EDD:   05/12/21
 Best:          36w 1d     Det. By:  LMP  (08/19/20)          EDD:   05/26/21
Anatomy

 Cranium:               Appears normal         LVOT:                   Appears normal
 Cavum:                 Appears normal         Aortic Arch:            Appears normal
 Ventricles:            Appears normal         Stomach:                Appears normal, left
                                                                       sided
 Heart:                 Appears normal         Kidneys:                Appear normal
                        (4CH, axis, and
                        situs)
 RVOT:                  Appears normal         Bladder:                Appears normal
Impression

 Follow up growth due to size and dates.
 Normal interval growth with measurements consistent with
 large for gestational age.
 Good fetal movement and amniotic fluid volume

 I discussed with Ms. Haggi via phone call that overall the
 baby is measuring large for gestational age. She reports that
 she had a prior cesarean delivery due to fetal distress and did
 not enter labor.
 I conveyed that it is reasonable for her to attempt a TOLAC,
 as a shared decision making decision between her and her
 provider, given that the cause for the original cesarean
 delivery was for a non-recurrent cause.
 I would caution against operative delivery or diverging too
 much from the labor curve.

 She is aware of a 1% chance of uterine rupture and
 increased risk for TOLAC failure when induced.
 All questions answered.
Recommendations

 Follow up a clinically indicated.

*** End of Addendum ***\.br----------------------------------------------------------------------

                                                            OB/GYN

                                                      SKLENAROVA

Indications

 Advanced maternal age multigravida 35+,
 third trimester
 Personal history of congenital abnormality
 (ASD requiring surgery)
 Obesity complicating pregnancy, third
 trimester (BMI 36.98)
 Encounter for antenatal screening for
 malformations
 History of cesarean delivery, currently
 pregnant
 36 weeks gestation of pregnancy
 LR NIPS - per patient
 NORMAL Fetal Echo
Fetal Evaluation

 Num Of Fetuses:         1
 Cardiac Activity:       Observed
 Presentation:           Cephalic
 Placenta:               Anterior
 P. Cord Insertion:      Previously Visualized
 Amniotic Fluid
 AFI FV:      Subjectively upper-normal

 AFI Sum(cm)     %Tile       Largest Pocket(cm)
 24.2            92

 RUQ(cm)       RLQ(cm)       LUQ(cm)        LLQ(cm)

Biometry

 BPD:      92.7  mm     G. Age:  37w 5d         91  %    CI:        76.86   %    70 - 86
                                                         FL/HC:      22.3   %    20.1 -
 HC:      334.9  mm     G. Age:  38w 2d         73  %    HC/AC:      0.97        0.93 -
 AC:      344.5  mm     G. Age:  38w 2d         97  %    FL/BPD:     80.7   %    71 - 87
 FL:       74.8  mm     G. Age:  38w 2d         91  %    FL/AC:      21.7   %    20 - 24

 LV:        7.7  mm

 Est. FW:    6669  gm      7 lb 9 oz     95  %
OB History

 Gravidity:    2         Term:   1
Gestational Age

 LMP:           36w 1d        Date:  08/19/20                 EDD:   05/26/21
 U/S Today:     38w 1d                                        EDD:   05/12/21
 Best:          36w 1d     Det. By:  LMP  (08/19/20)          EDD:   05/26/21
Anatomy

 Cranium:               Appears normal         LVOT:                   Appears normal
 Cavum:                 Appears normal         Aortic Arch:            Appears normal
 Ventricles:            Appears normal         Stomach:                Appears normal, left
                                                                       sided
 Heart:                 Appears normal         Kidneys:                Appear normal
                        (4CH, axis, and
                        situs)
 RVOT:                  Appears normal         Bladder:                Appears normal
Impression

 Follow up growth due to size and dates.
 Normal interval growth with measurements consistent with
 large for gestational age.
 Good fetal movement and amniotic fluid volume
Recommendations

 Follow up a clinically indicated.
